# Patient Record
Sex: Male | Born: 1960 | Race: Black or African American | Hispanic: No | Marital: Single | Smoking: Former smoker
Health system: Southern US, Community
[De-identification: ages and names within clinical notes are randomized; demographics above are authoritative.]

## PROBLEM LIST (undated history)

## (undated) ENCOUNTER — Emergency Department (HOSPITAL_COMMUNITY): Admission: EM | Payer: No Typology Code available for payment source | Source: Home / Self Care

## (undated) DIAGNOSIS — K219 Gastro-esophageal reflux disease without esophagitis: Secondary | ICD-10-CM

## (undated) DIAGNOSIS — M1990 Unspecified osteoarthritis, unspecified site: Secondary | ICD-10-CM

## (undated) DIAGNOSIS — M549 Dorsalgia, unspecified: Secondary | ICD-10-CM

## (undated) DIAGNOSIS — I6529 Occlusion and stenosis of unspecified carotid artery: Secondary | ICD-10-CM

## (undated) DIAGNOSIS — I10 Essential (primary) hypertension: Secondary | ICD-10-CM

## (undated) HISTORY — DX: Occlusion and stenosis of unspecified carotid artery: I65.29

## (undated) HISTORY — DX: Essential (primary) hypertension: I10

---

## 1997-12-05 ENCOUNTER — Emergency Department (HOSPITAL_COMMUNITY): Admission: EM | Admit: 1997-12-05 | Discharge: 1997-12-05 | Payer: Self-pay | Admitting: Emergency Medicine

## 1998-02-24 ENCOUNTER — Emergency Department (HOSPITAL_COMMUNITY): Admission: EM | Admit: 1998-02-24 | Discharge: 1998-02-24 | Payer: Self-pay | Admitting: Emergency Medicine

## 2004-01-29 ENCOUNTER — Emergency Department (HOSPITAL_COMMUNITY): Admission: EM | Admit: 2004-01-29 | Discharge: 2004-01-29 | Payer: Self-pay | Admitting: Emergency Medicine

## 2005-01-20 ENCOUNTER — Emergency Department (HOSPITAL_COMMUNITY): Admission: EM | Admit: 2005-01-20 | Discharge: 2005-01-20 | Payer: Self-pay | Admitting: Emergency Medicine

## 2005-03-16 ENCOUNTER — Encounter: Admission: RE | Admit: 2005-03-16 | Discharge: 2005-03-16 | Payer: Self-pay | Admitting: Family Medicine

## 2013-10-14 ENCOUNTER — Encounter (HOSPITAL_COMMUNITY): Payer: Self-pay | Admitting: Emergency Medicine

## 2013-10-14 ENCOUNTER — Emergency Department (HOSPITAL_COMMUNITY)
Admission: EM | Admit: 2013-10-14 | Discharge: 2013-10-14 | Disposition: A | Discharge status: Home / Self Care | Payer: No Typology Code available for payment source | Attending: Emergency Medicine | Admitting: Emergency Medicine

## 2013-10-14 DIAGNOSIS — T671XXA Heat syncope, initial encounter: Secondary | ICD-10-CM | POA: Insufficient documentation

## 2013-10-14 DIAGNOSIS — R404 Transient alteration of awareness: Secondary | ICD-10-CM | POA: Insufficient documentation

## 2013-10-14 DIAGNOSIS — Y9229 Other specified public building as the place of occurrence of the external cause: Secondary | ICD-10-CM | POA: Insufficient documentation

## 2013-10-14 DIAGNOSIS — X30XXXA Exposure to excessive natural heat, initial encounter: Secondary | ICD-10-CM | POA: Insufficient documentation

## 2013-10-14 DIAGNOSIS — T671XXS Heat syncope, sequela: Secondary | ICD-10-CM

## 2013-10-14 DIAGNOSIS — Y9389 Activity, other specified: Secondary | ICD-10-CM | POA: Insufficient documentation

## 2013-10-14 HISTORY — DX: Dorsalgia, unspecified: M54.9

## 2013-10-14 LAB — COMPREHENSIVE METABOLIC PANEL
ALT: 15 U/L (ref 0–53)
AST: 19 U/L (ref 0–37)
Albumin: 3.9 g/dL (ref 3.5–5.2)
Alkaline Phosphatase: 68 U/L (ref 39–117)
Anion gap: 11 (ref 5–15)
BUN: 19 mg/dL (ref 6–23)
CO2: 25 mEq/L (ref 19–32)
Calcium: 9 mg/dL (ref 8.4–10.5)
Chloride: 103 mEq/L (ref 96–112)
Creatinine, Ser: 0.88 mg/dL (ref 0.50–1.35)
GFR calc Af Amer: >90 mL/min (ref >90)
GFR calc non Af Amer: >90 mL/min (ref >90)
Glucose, Bld: 130 mg/dL — ABNORMAL HIGH (ref 70–99)
Potassium: 3.1 mEq/L — ABNORMAL LOW (ref 3.7–5.3)
Sodium: 139 mEq/L (ref 137–147)
Total Bilirubin: 0.4 mg/dL (ref 0.3–1.2)
Total Protein: 7.1 g/dL (ref 6.0–8.3)

## 2013-10-14 LAB — CBC WITH DIFFERENTIAL/PLATELET
Basophils Absolute: 0 10*3/uL (ref 0.0–0.1)
Basophils Relative: 0 % (ref 0–1)
Eosinophils Absolute: 0 10*3/uL (ref 0.0–0.7)
Eosinophils Relative: 1 % (ref 0–5)
HCT: 38.7 % — ABNORMAL LOW (ref 39.0–52.0)
Hemoglobin: 14 g/dL (ref 13.0–17.0)
Lymphocytes Relative: 37 % (ref 12–46)
Lymphs Abs: 2.6 10*3/uL (ref 0.7–4.0)
MCH: 33 pg (ref 26.0–34.0)
MCHC: 36.2 g/dL — ABNORMAL HIGH (ref 30.0–36.0)
MCV: 91.3 fL (ref 78.0–100.0)
Monocytes Absolute: 0.6 10*3/uL (ref 0.1–1.0)
Monocytes Relative: 8 % (ref 3–12)
Neutro Abs: 3.7 10*3/uL (ref 1.7–7.7)
Neutrophils Relative %: 54 % (ref 43–77)
Platelets: 190 10*3/uL (ref 150–400)
RBC: 4.24 MIL/uL (ref 4.22–5.81)
RDW: 13.2 % (ref 11.5–15.5)
WBC: 6.9 10*3/uL (ref 4.0–10.5)

## 2013-10-14 LAB — TROPONIN I: Troponin I: <0.3 ng/mL (ref <0.30)

## 2013-10-14 MED ORDER — SODIUM CHLORIDE 0.9 % IV BOLUS (SEPSIS)
1000.0000 mL | Freq: Once | INTRAVENOUS | Status: AC
  Administered 2013-10-14: 1000 mL via INTRAVENOUS

## 2013-10-14 MED ORDER — POTASSIUM CHLORIDE CRYS ER 20 MEQ PO TBCR
40.0000 meq | EXTENDED_RELEASE_TABLET | Freq: Once | ORAL | Status: AC
  Administered 2013-10-14: 40 meq via ORAL
  Filled 2013-10-14: qty 2

## 2013-10-14 NOTE — ED Notes (Signed)
Pt states he got to hot at church and had a syncopal episode

## 2013-10-14 NOTE — ED Notes (Signed)
zofran 4 mg IV given by EMS prior to arrival

## 2013-10-14 NOTE — ED Provider Notes (Signed)
CSN: [id number]     Arrival date & time 10/14/13  1639 History  This chart was scribed for Brian LennertJoseph L Beldon Nowling, MD by Leona CarryG. Clay Sherrill, ED Scribe. The patient was seen in [facility]. The patient's care was started at 4:59 PM.     Chief Complaint  Patient presents with  . Loss of Consciousness    Patient is a 53 y.o. male presenting with syncope. The history is provided by the patient and the spouse. No language interpreter was used.  Loss of Consciousness Episode history:  Single Most recent episode:  Today Duration: "a few seconds" Progression:  Resolved Chronicity:  New Witnessed: yes   Associated symptoms: no chest pain, no headaches and no seizures    HPI Comments: Milus BanisterLewis Cannon is a 53 y.o. male who presents to the Emergency Department complaining of an episode of syncope that occurred earlier today. Patient reports that he was at church and began to feel very hot before losing consciousness. He reports one similar episode approximately 6 months ago, but states that he did not lose consciousness that time. Patient reports that he has not eaten anything prior to this syncopal episode. Patient's wife reports that he was only unconscious for "a few seconds." Patient denies CP.   Patient does not have a PCP.   Past Medical History  Diagnosis Date  . Back pain    History reviewed. No pertinent past surgical history. No family history on file. History  Substance Use Topics  . Smoking status: Never Smoker   . Smokeless tobacco: Not on file  . Alcohol Use: No    Review of Systems  Constitutional: Negative for appetite change and fatigue.  HENT: Negative for congestion, ear discharge and sinus pressure.   Eyes: Negative for discharge.  Respiratory: Negative for cough.   Cardiovascular: Positive for syncope. Negative for chest pain.  Gastrointestinal: Negative for abdominal pain and diarrhea.  Genitourinary: Negative for frequency and hematuria.  Musculoskeletal: Negative for back  pain.  Skin: Negative for rash.  Neurological: Positive for syncope. Negative for seizures and headaches.  Psychiatric/Behavioral: Negative for hallucinations.      Allergies  Review of patient's allergies indicates no known allergies.  Home Medications   Prior to Admission medications   Not on File   Triage Vitals: BP 100/70  Pulse 76  Temp(Src) 97.9 F (36.6 C)  Resp 14  SpO2 95% Physical Exam  Nursing note and vitals reviewed. Constitutional: He is oriented to person, place, and time. He appears well-developed.  HENT:  Head: Normocephalic.  Eyes: Conjunctivae and EOM are normal. No scleral icterus.  Neck: Neck supple. No thyromegaly present.  Cardiovascular: Normal rate and regular rhythm.  Exam reveals no gallop and no friction rub.   No murmur heard. Pulmonary/Chest: No stridor. He has no wheezes. He has no rales. He exhibits no tenderness.  Abdominal: He exhibits no distension. There is no tenderness. There is no rebound.  Musculoskeletal: Normal range of motion. He exhibits no edema.  Lymphadenopathy:    He has no cervical adenopathy.  Neurological: He is oriented to person, place, and time. He exhibits normal muscle tone. Coordination normal.  Skin: No rash noted. No erythema.  Psychiatric: He has a normal mood and affect. His behavior is normal.    ED Course  Procedures (including critical care time) DIAGNOSTIC STUDIES: Oxygen Saturation is 95% on room air, normal by my interpretation.    COORDINATION OF CARE: 5:03 PM-Discussed treatment plan which includes IV fluids, EKG, and labs  with pt at bedside and pt agreed to plan.     Labs Review Labs Reviewed - No data to display  Imaging Review No results found.   EKG Interpretation   Date/Time:  Sunday October 14 2013 16:42:42 EDT Ventricular Rate:  72 PR Interval:  205 QRS Duration: 77 QT Interval:  415 QTC Calculation: 454 R Axis:   14 Text Interpretation:  Sinus rhythm Borderline prolonged PR  interval  Borderline repolarization abnormality Confirmed by Caeleigh Prohaska  MD, Vijay Durflinger  [id number]) on 10/14/2013 6:34:31 PM      MDM   Final diagnoses:  None    Syncope,  From not eating ,  Pt to follow up with family mc  The chart was scribed for me under my direct supervision.  I personally performed the history, physical, and medical decision making and all procedures in the evaluation of this patient.Brian Lennert, MD 10/14/13 [phone]

## 2013-10-14 NOTE — Discharge Instructions (Signed)
Follow up this week for recheck.   You can try calling Dr. Lilly CoveNimish Gosrani for follow up

## 2017-06-20 ENCOUNTER — Encounter (HOSPITAL_COMMUNITY): Payer: Self-pay | Admitting: *Deleted

## 2017-06-20 ENCOUNTER — Emergency Department (HOSPITAL_COMMUNITY): Payer: Self-pay

## 2017-06-20 ENCOUNTER — Emergency Department (HOSPITAL_COMMUNITY)
Admission: EM | Admit: 2017-06-20 | Discharge: 2017-06-20 | Disposition: A | Discharge status: Home / Self Care | Payer: Self-pay | Attending: Emergency Medicine | Admitting: Emergency Medicine

## 2017-06-20 DIAGNOSIS — R110 Nausea: Secondary | ICD-10-CM

## 2017-06-20 DIAGNOSIS — R1013 Epigastric pain: Secondary | ICD-10-CM

## 2017-06-20 DIAGNOSIS — R142 Eructation: Secondary | ICD-10-CM | POA: Insufficient documentation

## 2017-06-20 DIAGNOSIS — R10811 Right upper quadrant abdominal tenderness: Secondary | ICD-10-CM

## 2017-06-20 DIAGNOSIS — R630 Anorexia: Secondary | ICD-10-CM | POA: Insufficient documentation

## 2017-06-20 LAB — COMPREHENSIVE METABOLIC PANEL
ALT: 14 U/L — ABNORMAL LOW (ref 17–63)
AST: 17 U/L (ref 15–41)
Albumin: 4.5 g/dL (ref 3.5–5.0)
Alkaline Phosphatase: 69 U/L (ref 38–126)
Anion gap: 12 (ref 5–15)
BUN: 11 mg/dL (ref 6–20)
CO2: 20 mmol/L — ABNORMAL LOW (ref 22–32)
Calcium: 9.6 mg/dL (ref 8.9–10.3)
Chloride: 108 mmol/L (ref 101–111)
Creatinine, Ser: 0.83 mg/dL (ref 0.61–1.24)
GFR calc Af Amer: >60 mL/min (ref >60)
GFR calc non Af Amer: >60 mL/min (ref >60)
Glucose, Bld: 97 mg/dL (ref 65–99)
Potassium: 3.5 mmol/L (ref 3.5–5.1)
Sodium: 140 mmol/L (ref 135–145)
Total Bilirubin: 0.9 mg/dL (ref 0.3–1.2)
Total Protein: 8.4 g/dL — ABNORMAL HIGH (ref 6.5–8.1)

## 2017-06-20 LAB — URINALYSIS, ROUTINE W REFLEX MICROSCOPIC
Bacteria, UA: NONE SEEN
Bilirubin Urine: NEGATIVE
Glucose, UA: NEGATIVE mg/dL
Ketones, ur: NEGATIVE mg/dL
Leukocytes, UA: NEGATIVE
Nitrite: NEGATIVE
Protein, ur: NEGATIVE mg/dL
Specific Gravity, Urine: 1.006 (ref 1.005–1.030)
WBC, UA: NONE SEEN WBC/hpf (ref 0–5)
pH: 9 — ABNORMAL HIGH (ref 5.0–8.0)

## 2017-06-20 LAB — CBC
HCT: 46.5 % (ref 39.0–52.0)
Hemoglobin: 16.8 g/dL (ref 13.0–17.0)
MCH: 34.1 pg — ABNORMAL HIGH (ref 26.0–34.0)
MCHC: 36.1 g/dL — ABNORMAL HIGH (ref 30.0–36.0)
MCV: 94.3 fL (ref 78.0–100.0)
Platelets: 258 10*3/uL (ref 150–400)
RBC: 4.93 MIL/uL (ref 4.22–5.81)
RDW: 12.9 % (ref 11.5–15.5)
WBC: 5.7 10*3/uL (ref 4.0–10.5)

## 2017-06-20 LAB — LIPASE, BLOOD: Lipase: 29 U/L (ref 11–51)

## 2017-06-20 MED ORDER — IOPAMIDOL (ISOVUE-300) INJECTION 61%
100.0000 mL | Freq: Once | INTRAVENOUS | Status: AC | PRN
  Administered 2017-06-20: 100 mL via INTRAVENOUS

## 2017-06-20 MED ORDER — MORPHINE SULFATE (PF) 4 MG/ML IV SOLN
4.0000 mg | Freq: Once | INTRAVENOUS | Status: AC
  Administered 2017-06-20: 4 mg via INTRAVENOUS
  Filled 2017-06-20: qty 1

## 2017-06-20 MED ORDER — CIPROFLOXACIN HCL 500 MG PO TABS: 500.0000 mg | ORAL_TABLET | Freq: Two times a day (BID) | ORAL | 0 refills | Status: DC

## 2017-06-20 MED ORDER — IOPAMIDOL (ISOVUE-300) INJECTION 61%
INTRAVENOUS | Status: AC
  Filled 2017-06-20: qty 100

## 2017-06-20 MED ORDER — METRONIDAZOLE 500 MG PO TABS: 500.0000 mg | ORAL_TABLET | Freq: Three times a day (TID) | ORAL | 0 refills | Status: AC

## 2017-06-20 MED ORDER — ONDANSETRON HCL 4 MG/2ML IJ SOLN
4.0000 mg | Freq: Once | INTRAMUSCULAR | Status: AC
  Administered 2017-06-20: 4 mg via INTRAVENOUS
  Filled 2017-06-20: qty 2

## 2017-06-20 MED ORDER — SODIUM CHLORIDE 0.9 % IV BOLUS
1000.0000 mL | Freq: Once | INTRAVENOUS | Status: AC
  Administered 2017-06-20: 1000 mL via INTRAVENOUS

## 2017-06-20 MED ORDER — ONDANSETRON 4 MG PO TBDP: 4.0000 mg | ORAL_TABLET | Freq: Three times a day (TID) | ORAL | 0 refills | Status: DC | PRN

## 2017-06-20 MED ORDER — ONDANSETRON 4 MG PO TBDP
4.0000 mg | ORAL_TABLET | Freq: Once | ORAL | Status: AC | PRN
  Administered 2017-06-20: 4 mg via ORAL
  Filled 2017-06-20: qty 1

## 2017-06-20 MED ORDER — HYDROCODONE-ACETAMINOPHEN 5-325 MG PO TABS: 1.0000 | ORAL_TABLET | ORAL | 0 refills | Status: DC | PRN

## 2017-06-20 NOTE — ED Notes (Signed)
IV removed, discharge instructions reviewed. Pt left with family and brother is taking pt home due to pt being given narcotics

## 2017-06-20 NOTE — Discharge Instructions (Addendum)
There is evidence of possible mild colitis on the CT scan.  Your symptoms may also be due to your gallbladder.  Hydration: Symptoms will be intensified and complicated by dehydration. Dehydration can also extend the duration of symptoms. Drink plenty of fluids and get plenty of rest. You should be drinking at least half a liter of water an hour to stay hydrated. Electrolyte drinks (ex. Gatorade, Powerade, Pedialyte) are also encouraged. You should be drinking enough fluids to make your urine light yellow, almost clear. If this is not the case, you are not drinking enough water. Please note that some of the treatments indicated below will not be effective if you are not adequately hydrated. Diet: Please concentrate on hydration, however, you may introduce food slowly.  Start with a clear liquid diet, progressed to a full liquid diet, and then bland solids as you are able. Pain or fever: Ibuprofen, Naproxen, or Tylenol for pain or fever.  Antiinflammatory medications: Take 600 mg of ibuprofen every 6 hours or 440 mg (over the counter dose) to 500 mg (prescription dose) of naproxen every 12 hours for the next 3 days. After this time, these medications may be used as needed for pain. Take these medications with food to avoid upset stomach. Choose only one of these medications, do not take them together. Tylenol: Should you continue to have additional pain while taking the ibuprofen or naproxen, you may add in tylenol as needed. Your daily total maximum amount of tylenol from all sources should be limited to 4000mg /day for persons without liver problems, or 2000mg /day for those with liver problems. Vicodin: May take Vicodin as needed for severe pain.  Do not drive or perform other dangerous activities while taking the Vicodin.  Please note that each pill of Vicodin contains 325 mg of Tylenol and the above dosage limits apply. Nausea/vomiting: Use the Zofran for nausea or vomiting.  Follow up: Follow-up with the  gastroenterologist as soon as possible.  Return to the ED for worsening symptoms.  Please also note the recommendation for repeat imaging of your abdominal aorta via ultrasound in 5 years to assure it has not increased in size. This information has been attached to this packet.

## 2017-06-20 NOTE — ED Provider Notes (Signed)
[facility]-EMERGENCY DEPT Provider Note   CSN: [id number] Arrival date & time: 06/20/17  1435     History   Chief Complaint Chief Complaint  Patient presents with  . Nausea    HPI Brian Cannon is a 57 y.o. male.  HPI  Brian Cannon is a 57 y.o. male, with a history of chronic back pain, presenting to the ED with abdominal pain intermittently for the past 7-8 days.  Pain is mostly in the epigastric and right upper quadrant regions, described as a squeezing or grabbing along with cramping, currently 8/10, radiating throughout the abdomen.  Accompanied by nausea, belching, and anorexia.  Pain seems to consistently flare with eating.  Diarrhea beginning last night.  He has tried Advil and Mylanta without relief.  He uses NSAIDs about once every 3 weeks.  He denies alcohol use.  Patient continues to have regular, daily bowel movements.  Denies fever, vomiting, hematochezia/melena, urinary complaints, cough, chest pain, shortness of breath, or any other complaints.   Past Medical History:  Diagnosis Date  . Back pain     There are no active problems to display for this patient.   History reviewed. No pertinent surgical history.      Home Medications    Prior to Admission medications   Medication Sig Start Date End Date Taking? Authorizing Provider  ciprofloxacin (CIPRO) 500 MG tablet Take 1 tablet (500 mg total) by mouth 2 (two) times daily. 06/20/17   Joy, Shawn C, PA-C  HYDROcodone-acetaminophen (NORCO/VICODIN) 5-325 MG tablet Take 1-2 tablets by mouth every 4 (four) hours as needed for severe pain. 06/20/17   Joy, Shawn C, PA-C  metroNIDAZOLE (FLAGYL) 500 MG tablet Take 1 tablet (500 mg total) by mouth 3 (three) times daily for 7 days. 06/20/17 06/27/17  Joy, Shawn C, PA-C  ondansetron (ZOFRAN ODT) 4 MG disintegrating tablet Take 1 tablet (4 mg total) by mouth every 8 (eight) hours as needed for nausea or vomiting. 06/20/17   Joy, Hillard DankerShawn C, PA-C    Family  History No family history on file.  Social History Social History   Tobacco Use  . Smoking status: Never Smoker  . Smokeless tobacco: Never Used  Substance Use Topics  . Alcohol use: No  . Drug use: No     Allergies   Patient has no known allergies.   Review of Systems Review of Systems  Constitutional: Negative for chills, diaphoresis and fever.  Respiratory: Negative for cough and shortness of breath.   Cardiovascular: Negative for chest pain.  Gastrointestinal: Positive for abdominal pain, diarrhea and nausea. Negative for abdominal distention, blood in stool, constipation and vomiting.  Genitourinary: Negative for difficulty urinating, dysuria, frequency and hematuria.  Musculoskeletal: Negative for back pain.  All other systems reviewed and are negative.    Physical Exam Updated Vital Signs BP 131/85   Pulse 84   Temp 98.2 F (36.8 C) (Oral)   Resp 18   SpO2 97%   Physical Exam  Constitutional: He appears well-developed and well-nourished. No distress.  HENT:  Head: Normocephalic and atraumatic.  Eyes: Conjunctivae are normal.  Neck: Neck supple.  Cardiovascular: Normal rate, regular rhythm, normal heart sounds and intact distal pulses.  Pulmonary/Chest: Effort normal and breath sounds normal. No respiratory distress.  Abdominal: Soft. There is tenderness. There is no guarding and no CVA tenderness.    Musculoskeletal: He exhibits no edema.  Lymphadenopathy:    He has no cervical adenopathy.  Neurological: He is alert.  Skin:  Skin is warm and dry. He is not diaphoretic.  Psychiatric: He has a normal mood and affect. His behavior is normal.  Nursing note and vitals reviewed.    ED Treatments / Results  Labs (all labs ordered are listed, but only abnormal results are displayed) Labs Reviewed  COMPREHENSIVE METABOLIC PANEL - Abnormal; Notable for the following components:      Result Value   CO2 20 (*)    Total Protein 8.4 (*)    ALT 14 (*)     All other components within normal limits  CBC - Abnormal; Notable for the following components:   MCH 34.1 (*)    MCHC 36.1 (*)    All other components within normal limits  URINALYSIS, ROUTINE W REFLEX MICROSCOPIC - Abnormal; Notable for the following components:   Color, Urine STRAW (*)    pH 9.0 (*)    Hgb urine dipstick SMALL (*)    Squamous Epithelial / LPF 0-5 (*)    All other components within normal limits  LIPASE, BLOOD    EKG None  Radiology Ct Abdomen Pelvis W Contrast  Result Date: 06/20/2017 CLINICAL DATA:  Abdominal pain with nausea and diarrhea EXAM: CT ABDOMEN AND PELVIS WITH CONTRAST TECHNIQUE: Multidetector CT imaging of the abdomen and pelvis was performed using the standard protocol following bolus administration of intravenous contrast. CONTRAST:  ISOVUE-300 IOPAMIDOL (ISOVUE-300) INJECTION 61% COMPARISON:  Ultrasound 06/20/2017 FINDINGS: Lower chest: No acute abnormality. Hepatobiliary: Subcentimeter hypodense lesions in the liver too small to further characterize. No calcified stones or biliary dilatation. Pancreas: Unremarkable. No pancreatic ductal dilatation or surrounding inflammatory changes. Spleen: Normal in size without focal abnormality. Adrenals/Urinary Tract: Adrenal glands are within normal limits. Cysts in the left kidney. Bladder unremarkable Stomach/Bowel: Stomach is within normal limits. Appendix appears normal. Mild wall thickening of the ascending colon. Sigmoid colon diverticular disease without acute inflammation. Vascular/Lymphatic: Moderate aortic atherosclerosis. Focal ectasia of the infrarenal abdominal aorta measuring up to 2.6 cm. No significantly enlarged lymph nodes. Reproductive: Prostate is unremarkable. Other: No abdominal wall hernia or abnormality. No abdominopelvic ascites. Small fat in the umbilical region. Musculoskeletal: No acute or significant osseous findings. IMPRESSION: 1. Possible mild wall thickening involving the ascending  colon as may be seen with mild colitis. 2. Otherwise no CT evidence for acute intra-abdominal or pelvic abnormality 3. Infrarenal abdominal aorta measures up to 2.6 cm. Ectatic abdominal aorta at risk for aneurysm development. Recommend followup by ultrasound in 5 years. This recommendation follows ACR consensus guidelines: White Paper of the ACR Incidental Findings Committee II on Vascular Findings. J Am Coll Radiol 2013; 10:789-794. Electronically Signed   By: Jasmine Pang M.D.   On: 06/20/2017 20:28   US Abdomen Limited Ruq  Result Date: 06/20/2017 CLINICAL DATA:  RIGHT upper quadrant pain and nausea for 2 weeks. EXAM: ULTRASOUND ABDOMEN LIMITED RIGHT UPPER QUADRANT COMPARISON:  None. FINDINGS: Gallbladder: No gallstones or wall thickening visualized. No sonographic Murphy sign noted by sonographer. Common bile duct: Diameter: 2 mm Liver: No focal lesion identified. Within normal limits in parenchymal echogenicity. Portal vein is patent on color Doppler imaging with normal direction of blood flow towards the liver. IMPRESSION: Normal RIGHT upper quadrant ultrasound. Electronically Signed   By: Awilda Metro M.D.   On: 06/20/2017 18:27    Procedures Procedures (including critical care time)  Medications Ordered in ED Medications  ondansetron (ZOFRAN-ODT) disintegrating tablet 4 mg (4 mg Oral Given 06/20/17 1452)  sodium chloride 0.9 % bolus 1,000 mL (0  mLs Intravenous Stopped 06/20/17 2157)  morphine 4 MG/ML injection 4 mg (4 mg Intravenous Given 06/20/17 1629)  ondansetron (ZOFRAN) injection 4 mg (4 mg Intravenous Given 06/20/17 1627)  iopamidol (ISOVUE-300) 61 % injection 100 mL (100 mLs Intravenous Contrast Given 06/20/17 1951)     Initial Impression / Assessment and Plan / ED Course  I have reviewed the triage vital signs and the nursing notes.  Pertinent labs & imaging results that were available during my care of the patient were reviewed by me and considered in my medical decision making  (see chart for details).  Clinical Course as of Jun 21 1133  Mon Jun 20, 2017  1746 Patient states he feels much better. Nausea has resolved.  Pain has improved to 3/10.   [SJ]  2045 Patient's symptoms have completely resolved.  No abdominal tenderness on reexam.   [SJ]    Clinical Course User Index [SJ] Joy, Shawn C, PA-C   Patient presents with intermittent abdominal pain.  Patient symptoms are suggestive of biliary colic. Patient is nontoxic appearing, afebrile, not tachycardic, not tachypneic, not hypotensive, and maintains excellent SPO2 on room air. No LFT or lipase elevations.  No leukocytosis.  Right upper quadrant ultrasound shows no acute abnormalities.  Abdominal CT shows possible mild colitis in the ascending colon.  This may or may not be what was causing the patient's symptoms.  Biliary colic is still a possibility.  Gastroenterology follow-up.  Tolerating PO at discharge. The patient was given instructions for home care as well as return precautions. Patient voices understanding of these instructions, accepts the plan, and is comfortable with discharge.   We were able to resolve his pain and nausea quite early into his ED course without recurrence.      Vitals:   06/20/17 1439 06/20/17 2155  BP: 131/85 111/88  Pulse: 84 79  Resp: 18 18  Temp: 98.2 F (36.8 C)   TempSrc: Oral   SpO2: 97% 99%   Patient's vitals were monitored throughout his ED course and were found to be consistent with the above ranges.  Final Clinical Impressions(s) / ED Diagnoses   Final diagnoses:  RUQ abdominal tenderness  Epigastric pain  Nausea    ED Discharge Orders        Ordered    HYDROcodone-acetaminophen (NORCO/VICODIN) 5-325 MG tablet  Every 4 hours PRN     06/20/17 2126    ondansetron (ZOFRAN ODT) 4 MG disintegrating tablet  Every 8 hours PRN     06/20/17 2126    ciprofloxacin (CIPRO) 500 MG tablet  2 times daily     06/20/17 2126    metroNIDAZOLE (FLAGYL) 500 MG tablet  3  times daily     06/20/17 2126       Concepcion Living 06/21/17 1135    Nira Conn, MD 06/21/17 [phone]

## 2017-06-20 NOTE — ED Triage Notes (Signed)
Per EMS, pt from home complains of nausea x 3 days. PT had diarrhea last night. Pt has not vomited, denies pain.   BP 146/86 HR 72 RR 16 O2 98% on RA CBG 89

## 2021-06-06 ENCOUNTER — Inpatient Hospital Stay (HOSPITAL_COMMUNITY)
Admission: EM | Admit: 2021-06-06 | Discharge: 2021-06-11 | DRG: 300 | Disposition: A | Discharge status: Home / Self Care | Payer: Self-pay | Attending: Internal Medicine | Admitting: Internal Medicine

## 2021-06-06 ENCOUNTER — Emergency Department (HOSPITAL_COMMUNITY): Payer: Self-pay

## 2021-06-06 ENCOUNTER — Encounter (HOSPITAL_COMMUNITY): Payer: Self-pay

## 2021-06-06 DIAGNOSIS — Z8042 Family history of malignant neoplasm of prostate: Secondary | ICD-10-CM

## 2021-06-06 DIAGNOSIS — I7102 Dissection of abdominal aorta: Secondary | ICD-10-CM | POA: Diagnosis present

## 2021-06-06 DIAGNOSIS — I714 Abdominal aortic aneurysm, without rupture, unspecified: Secondary | ICD-10-CM

## 2021-06-06 DIAGNOSIS — I71019 Dissection of thoracic aorta, unspecified: Secondary | ICD-10-CM | POA: Diagnosis present

## 2021-06-06 DIAGNOSIS — E871 Hypo-osmolality and hyponatremia: Secondary | ICD-10-CM

## 2021-06-06 DIAGNOSIS — Z8249 Family history of ischemic heart disease and other diseases of the circulatory system: Secondary | ICD-10-CM

## 2021-06-06 DIAGNOSIS — Z79899 Other long term (current) drug therapy: Secondary | ICD-10-CM

## 2021-06-06 DIAGNOSIS — F17210 Nicotine dependence, cigarettes, uncomplicated: Secondary | ICD-10-CM | POA: Diagnosis present

## 2021-06-06 DIAGNOSIS — K5900 Constipation, unspecified: Secondary | ICD-10-CM | POA: Clinically undetermined

## 2021-06-06 DIAGNOSIS — I71012 Dissection of descending thoracic aorta: Secondary | ICD-10-CM

## 2021-06-06 DIAGNOSIS — Z20822 Contact with and (suspected) exposure to covid-19: Secondary | ICD-10-CM | POA: Diagnosis present

## 2021-06-06 DIAGNOSIS — I7103 Dissection of thoracoabdominal aorta: Principal | ICD-10-CM | POA: Diagnosis present

## 2021-06-06 DIAGNOSIS — I708 Atherosclerosis of other arteries: Secondary | ICD-10-CM | POA: Diagnosis present

## 2021-06-06 DIAGNOSIS — E876 Hypokalemia: Secondary | ICD-10-CM

## 2021-06-06 DIAGNOSIS — R109 Unspecified abdominal pain: Secondary | ICD-10-CM | POA: Clinically undetermined

## 2021-06-06 DIAGNOSIS — I10 Essential (primary) hypertension: Secondary | ICD-10-CM

## 2021-06-06 LAB — CBC
HCT: 38.5 % — ABNORMAL LOW (ref 39.0–52.0)
HCT: 39.5 % (ref 39.0–52.0)
HCT: 37.9 % — ABNORMAL LOW (ref 39.0–52.0)
HCT: 38.1 % — ABNORMAL LOW (ref 39.0–52.0)
Hemoglobin: 13.8 g/dL (ref 13.0–17.0)
Hemoglobin: 13.7 g/dL (ref 13.0–17.0)
Hemoglobin: 13.4 g/dL (ref 13.0–17.0)
Hemoglobin: 13.5 g/dL (ref 13.0–17.0)
MCH: 33.7 pg (ref 26.0–34.0)
MCH: 33 pg (ref 26.0–34.0)
MCH: 33.8 pg (ref 26.0–34.0)
MCH: 33.8 pg (ref 26.0–34.0)
MCHC: 35.8 g/dL (ref 30.0–36.0)
MCHC: 34.7 g/dL (ref 30.0–36.0)
MCHC: 35.4 g/dL (ref 30.0–36.0)
MCHC: 35.4 g/dL (ref 30.0–36.0)
MCV: 94.1 fL (ref 80.0–100.0)
MCV: 95.2 fL (ref 80.0–100.0)
MCV: 95.7 fL (ref 80.0–100.0)
MCV: 95.5 fL (ref 80.0–100.0)
Platelets: 197 10*3/uL (ref 150–400)
Platelets: 200 10*3/uL (ref 150–400)
Platelets: 181 10*3/uL (ref 150–400)
Platelets: 176 10*3/uL (ref 150–400)
RBC: 4.09 MIL/uL — ABNORMAL LOW (ref 4.22–5.81)
RBC: 4.15 MIL/uL — ABNORMAL LOW (ref 4.22–5.81)
RBC: 3.96 MIL/uL — ABNORMAL LOW (ref 4.22–5.81)
RBC: 3.99 MIL/uL — ABNORMAL LOW (ref 4.22–5.81)
RDW: 13 % (ref 11.5–15.5)
RDW: 12.8 % (ref 11.5–15.5)
RDW: 13 % (ref 11.5–15.5)
RDW: 13.1 % (ref 11.5–15.5)
WBC: 8.1 10*3/uL (ref 4.0–10.5)
WBC: 8.2 10*3/uL (ref 4.0–10.5)
WBC: 8.2 10*3/uL (ref 4.0–10.5)
WBC: 7.2 10*3/uL (ref 4.0–10.5)
nRBC: 0 % (ref 0.0–0.2)
nRBC: 0 % (ref 0.0–0.2)
nRBC: 0 % (ref 0.0–0.2)
nRBC: 0 % (ref 0.0–0.2)

## 2021-06-06 LAB — SURGICAL PCR SCREEN
MRSA, PCR: NEGATIVE
Staphylococcus aureus: NEGATIVE

## 2021-06-06 LAB — COMPREHENSIVE METABOLIC PANEL
ALT: 17 U/L (ref 0–44)
ALT: 16 U/L (ref 0–44)
AST: 25 U/L (ref 15–41)
AST: 17 U/L (ref 15–41)
Albumin: 4.1 g/dL (ref 3.5–5.0)
Albumin: 3.3 g/dL — ABNORMAL LOW (ref 3.5–5.0)
Alkaline Phosphatase: 56 U/L (ref 38–126)
Alkaline Phosphatase: 52 U/L (ref 38–126)
Anion gap: 10 (ref 5–15)
Anion gap: 7 (ref 5–15)
BUN: 18 mg/dL (ref 8–23)
BUN: 11 mg/dL (ref 8–23)
CO2: 22 mmol/L (ref 22–32)
CO2: 23 mmol/L (ref 22–32)
Calcium: 8.7 mg/dL — ABNORMAL LOW (ref 8.9–10.3)
Calcium: 8.4 mg/dL — ABNORMAL LOW (ref 8.9–10.3)
Chloride: 102 mmol/L (ref 98–111)
Chloride: 106 mmol/L (ref 98–111)
Creatinine, Ser: 0.91 mg/dL (ref 0.61–1.24)
Creatinine, Ser: 0.87 mg/dL (ref 0.61–1.24)
GFR, Estimated: >60 mL/min (ref >60)
GFR, Estimated: >60 mL/min (ref >60)
Glucose, Bld: 107 mg/dL — ABNORMAL HIGH (ref 70–99)
Glucose, Bld: 105 mg/dL — ABNORMAL HIGH (ref 70–99)
Potassium: 3.2 mmol/L — ABNORMAL LOW (ref 3.5–5.1)
Potassium: 4.1 mmol/L (ref 3.5–5.1)
Sodium: 134 mmol/L — ABNORMAL LOW (ref 135–145)
Sodium: 136 mmol/L (ref 135–145)
Total Bilirubin: 0.6 mg/dL (ref 0.3–1.2)
Total Bilirubin: 0.9 mg/dL (ref 0.3–1.2)
Total Protein: 7.4 g/dL (ref 6.5–8.1)
Total Protein: 6.1 g/dL — ABNORMAL LOW (ref 6.5–8.1)

## 2021-06-06 LAB — RAPID URINE DRUG SCREEN, HOSP PERFORMED
Amphetamines: NOT DETECTED
Barbiturates: NOT DETECTED
Benzodiazepines: NOT DETECTED
Cocaine: NOT DETECTED
Opiates: NOT DETECTED
Tetrahydrocannabinol: POSITIVE — AB

## 2021-06-06 LAB — LIPASE, BLOOD: Lipase: 30 U/L (ref 11–51)

## 2021-06-06 LAB — BASIC METABOLIC PANEL
Anion gap: 8 (ref 5–15)
BUN: 10 mg/dL (ref 8–23)
CO2: 23 mmol/L (ref 22–32)
Calcium: 8.3 mg/dL — ABNORMAL LOW (ref 8.9–10.3)
Chloride: 106 mmol/L (ref 98–111)
Creatinine, Ser: 0.93 mg/dL (ref 0.61–1.24)
GFR, Estimated: >60 mL/min (ref >60)
Glucose, Bld: 89 mg/dL (ref 70–99)
Potassium: 3.3 mmol/L — ABNORMAL LOW (ref 3.5–5.1)
Sodium: 137 mmol/L (ref 135–145)

## 2021-06-06 LAB — PROTIME-INR
INR: 1 (ref 0.8–1.2)
Prothrombin Time: 13.6 seconds (ref 11.4–15.2)

## 2021-06-06 LAB — LACTIC ACID, PLASMA
Lactic Acid, Venous: 1.4 mmol/L (ref 0.5–1.9)
Lactic Acid, Venous: 1.1 mmol/L (ref 0.5–1.9)

## 2021-06-06 LAB — HIV ANTIBODY (ROUTINE TESTING W REFLEX): HIV Screen 4th Generation wRfx: NONREACTIVE

## 2021-06-06 LAB — CREATININE, SERUM
Creatinine, Ser: 0.78 mg/dL (ref 0.61–1.24)
GFR, Estimated: >60 mL/min (ref >60)

## 2021-06-06 LAB — MAGNESIUM: Magnesium: 1.7 mg/dL (ref 1.7–2.4)

## 2021-06-06 MED ORDER — CHLORHEXIDINE GLUCONATE CLOTH 2 % EX PADS
6.0000 | MEDICATED_PAD | Freq: Every day | CUTANEOUS | Status: DC
  Administered 2021-06-06 – 2021-06-09 (×6): 6 via TOPICAL

## 2021-06-06 MED ORDER — ONDANSETRON HCL 4 MG PO TABS: 4.0000 mg | ORAL_TABLET | Freq: Four times a day (QID) | ORAL | Status: DC | PRN

## 2021-06-06 MED ORDER — ONDANSETRON HCL 4 MG/5ML PO SOLN
4.0000 mg | Freq: Once | ORAL | Status: DC
  Filled 2021-06-06: qty 5

## 2021-06-06 MED ORDER — ONDANSETRON HCL 4 MG/2ML IJ SOLN
4.0000 mg | Freq: Four times a day (QID) | INTRAMUSCULAR | Status: DC | PRN
  Administered 2021-06-06 – 2021-06-10 (×6): 4 mg via INTRAVENOUS
  Filled 2021-06-06 (×6): qty 2

## 2021-06-06 MED ORDER — SODIUM CHLORIDE 0.9 % IV SOLN
1000.0000 mL | INTRAVENOUS | Status: DC
  Administered 2021-06-06: 1000 mL via INTRAVENOUS

## 2021-06-06 MED ORDER — HYDROMORPHONE HCL 2 MG/ML IJ SOLN
2.0000 mg | INTRAMUSCULAR | Status: AC
  Administered 2021-06-06: 2 mg via INTRAVENOUS
  Filled 2021-06-06: qty 1

## 2021-06-06 MED ORDER — HYDROMORPHONE HCL 1 MG/ML IJ SOLN
2.0000 mg | INTRAMUSCULAR | Status: DC | PRN
  Administered 2021-06-06 – 2021-06-07 (×3): 2 mg via INTRAVENOUS
  Filled 2021-06-06 (×4): qty 2

## 2021-06-06 MED ORDER — LABETALOL HCL 5 MG/ML IV SOLN
0.5000 mg/min | Status: DC
  Administered 2021-06-06: 0.5 mg/min via INTRAVENOUS
  Administered 2021-06-06: 2 mg/min via INTRAVENOUS
  Administered 2021-06-07: 1 mg/min via INTRAVENOUS
  Administered 2021-06-07: 2 mg/min via INTRAVENOUS
  Administered 2021-06-07: 1 mg/min via INTRAVENOUS
  Administered 2021-06-07 – 2021-06-08 (×2): 3 mg/min via INTRAVENOUS
  Filled 2021-06-06 (×9): qty 80

## 2021-06-06 MED ORDER — LABETALOL HCL 100 MG PO TABS
100.0000 mg | ORAL_TABLET | Freq: Two times a day (BID) | ORAL | Status: DC
Start: 2021-06-06 — End: 2021-06-08
  Administered 2021-06-07 (×2): 100 mg via ORAL
  Filled 2021-06-06 (×2): qty 1

## 2021-06-06 MED ORDER — POTASSIUM CHLORIDE CRYS ER 20 MEQ PO TBCR
40.0000 meq | EXTENDED_RELEASE_TABLET | Freq: Two times a day (BID) | ORAL | Status: AC
  Administered 2021-06-06 (×2): 40 meq via ORAL
  Filled 2021-06-06 (×2): qty 2

## 2021-06-06 MED ORDER — LABETALOL HCL 5 MG/ML IV SOLN
10.0000 mg | Freq: Once | INTRAVENOUS | Status: AC
  Administered 2021-06-06: 10 mg via INTRAVENOUS
  Filled 2021-06-06: qty 4

## 2021-06-06 MED ORDER — PROCHLORPERAZINE EDISYLATE 10 MG/2ML IJ SOLN
10.0000 mg | Freq: Once | INTRAMUSCULAR | Status: AC
  Administered 2021-06-06: 10 mg via INTRAVENOUS
  Filled 2021-06-06: qty 2

## 2021-06-06 MED ORDER — IOHEXOL 300 MG/ML  SOLN
100.0000 mL | Freq: Once | INTRAMUSCULAR | Status: AC | PRN
  Administered 2021-06-06: 100 mL via INTRAVENOUS

## 2021-06-06 MED ORDER — SODIUM CHLORIDE 0.9 % IV SOLN: INTRAVENOUS | Status: AC

## 2021-06-06 MED ORDER — HYDROMORPHONE HCL 2 MG/ML IJ SOLN: 2.0000 mg | INTRAMUSCULAR | Status: DC | PRN

## 2021-06-06 MED ORDER — LIDOCAINE VISCOUS HCL 2 % MT SOLN
15.0000 mL | Freq: Once | OROMUCOSAL | Status: AC
  Administered 2021-06-06: 15 mL via ORAL
  Filled 2021-06-06: qty 15

## 2021-06-06 MED ORDER — IOHEXOL 350 MG/ML SOLN
90.0000 mL | Freq: Once | INTRAVENOUS | Status: AC | PRN
  Administered 2021-06-06: 90 mL via INTRAVENOUS

## 2021-06-06 MED ORDER — SODIUM CHLORIDE 0.9 % IV BOLUS (SEPSIS)
1000.0000 mL | Freq: Once | INTRAVENOUS | Status: AC
  Administered 2021-06-06: 1000 mL via INTRAVENOUS

## 2021-06-06 MED ORDER — POLYETHYLENE GLYCOL 3350 17 G PO PACK: 17.0000 g | PACK | Freq: Two times a day (BID) | ORAL | Status: AC

## 2021-06-06 MED ORDER — ENOXAPARIN SODIUM 40 MG/0.4ML IJ SOSY
40.0000 mg | PREFILLED_SYRINGE | INTRAMUSCULAR | Status: DC
  Administered 2021-06-06 – 2021-06-11 (×6): 40 mg via SUBCUTANEOUS
  Filled 2021-06-06 (×6): qty 0.4

## 2021-06-06 MED ORDER — POLYETHYLENE GLYCOL 3350 17 G PO PACK
17.0000 g | PACK | Freq: Every day | ORAL | Status: DC | PRN
  Administered 2021-06-07 – 2021-06-10 (×2): 17 g via ORAL
  Filled 2021-06-06 (×2): qty 1

## 2021-06-06 MED ORDER — SODIUM CHLORIDE 0.9 % IV BOLUS
1000.0000 mL | Freq: Once | INTRAVENOUS | Status: AC
  Administered 2021-06-06: 1000 mL via INTRAVENOUS

## 2021-06-06 MED ORDER — ALUM & MAG HYDROXIDE-SIMETH 200-200-20 MG/5ML PO SUSP
30.0000 mL | Freq: Once | ORAL | Status: AC
  Administered 2021-06-06: 30 mL via ORAL
  Filled 2021-06-06: qty 30

## 2021-06-06 MED ORDER — HYDROCODONE-ACETAMINOPHEN 5-325 MG PO TABS
1.0000 | ORAL_TABLET | ORAL | Status: DC | PRN
  Administered 2021-06-06 – 2021-06-08 (×9): 2 via ORAL
  Administered 2021-06-09: 1 via ORAL
  Filled 2021-06-06 (×9): qty 2
  Filled 2021-06-06: qty 1
  Filled 2021-06-06 (×2): qty 2

## 2021-06-06 MED ORDER — HYDROMORPHONE HCL 1 MG/ML IJ SOLN
1.0000 mg | Freq: Once | INTRAMUSCULAR | Status: AC
  Administered 2021-06-06: 1 mg via INTRAVENOUS
  Filled 2021-06-06: qty 1

## 2021-06-06 MED ORDER — HYOSCYAMINE SULFATE 0.125 MG SL SUBL
0.2500 mg | SUBLINGUAL_TABLET | Freq: Once | SUBLINGUAL | Status: AC
  Administered 2021-06-06: 0.25 mg via SUBLINGUAL
  Filled 2021-06-06: qty 2

## 2021-06-06 NOTE — ED Triage Notes (Signed)
Pt comes via [facility] from home for abd pain, epigastric, hx of GERD not relieved by OTC meds, PTA received of fentanyl and 16 mg of ketamine and 4mg  of zofran  ?

## 2021-06-06 NOTE — Plan of Care (Signed)

## 2021-06-06 NOTE — H&P (Addendum)
? ? ?History and Physical ? ?Brian Cannon DVV:[id number] DOB: 03/20/1960 DOA: 06/06/2021 ? ?PCP: Pcp, No ?Patient coming from: Home ? ?I have personally briefly reviewed patient's old medical records in [facility] ? ? ?Chief Complaint: Abdominal pain ? ?HPI: Brian Cannon is a 61 y.o. male past medical history of tobacco abuse for 40 years having abdominal pain 5 days prior to admission he relates pain which has progressively gotten worse is just constant radiates to his back a comfortable position he denies any fever cough or diarrhea. 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 ? ?Review of Systems: All systems reviewed and apart from history of presenting illness, are negative. ? ?Past Medical History:  ?Diagnosis Date  ? Back pain   ? ?History reviewed. No pertinent surgical history. ?Social History:  reports that he has been smoking cigarettes. He has been smoking an average of 1 pack per day. He has never used smokeless tobacco. He reports current alcohol use of about 1.0 standard drink per week. He reports that he does not use drugs. ? ? ?No Known Allergies ? ?Family History  ?Problem Relation Age of Onset  ? Heart attack Mother   ? Prostate cancer Father   ? ? ?Prior to Admission medications   ?Medication Sig Start Date End Date Taking? Authorizing Provider  ?ciprofloxacin (CIPRO) 500 MG tablet Take 1 tablet (500 mg total) by mouth 2 (two) times daily. 06/20/17   Joy, Shawn C, PA-C  ?HYDROcodone-acetaminophen (NORCO/VICODIN) 5-325 MG tablet Take 1-2 tablets by mouth every 4 (four) hours as needed for severe pain. 06/20/17   Joy, Shawn C, PA-C  ?ondansetron (ZOFRAN ODT) 4 MG disintegrating tablet Take 1 tablet (4 mg  total) by mouth every 8 (eight) hours as needed for nausea or vomiting. 06/20/17   Joy, Hillard Danker, PA-C  ? ?Physical Exam: ?Vitals:  ? 06/06/21 0645 06/06/21 0700 06/06/21 0715 06/06/21 0730  ?BP: (!) 157/81 (!) 140/57 (!) 142/88 132/68  ?Pulse: 80 73 75 72  ?Resp: 14 12 12 13   ?Temp:      ?TempSrc:      ?SpO2: 92% 93% 90% 92%  ? ? ?General exam: Moderately built and nourished patient, lying comfortably supine on the gurney in no obvious distress. ?Head, eyes and ENT: Nontraumatic and normocephalic.  ?Neck: Supple. No JVD, carotid bruit or thyromegaly. ?Lymphatics: No lymphadenopathy. ?Respiratory system: Clear to auscultation. No increased work of breathing. ?Cardiovascular system: S1 and S2 heard, RRR. No JVD. ?Gastrointestinal system: Abdomen is nondistended, soft and nontender. ?Central nervous system: Alert and oriented. No focal neurological deficits. ?Extremities: Symmetric 5 x 5 power. Peripheral pulses symmetrically felt.  ?Skin: No rashes or acute findings. ?Musculoskeletal system: Negative exam. ?Psychiatry: Pleasant and cooperative. ? ? ?Labs on Admission:  ?Basic Metabolic Panel: ?Recent Labs  ?Lab 06/06/21 ?0253  ?NA 134*  ?K 3.2*  ?CL 102  ?CO2 22  ?GLUCOSE 107*  ?BUN 18  ?CREATININE 0.91  ?CALCIUM 8.7*  ? ?Liver Function Tests: ?Recent Labs  ?Lab 06/06/21 ?0253  ?AST 25  ?ALT 17  ?ALKPHOS 56  ?BILITOT 0.6  ?PROT 7.4  ?ALBUMIN 4.1  ? ?Recent Labs  ?Lab 06/06/21 ?0253  ?LIPASE 30  ? ?No  results for input(s): AMMONIA in the last 168 hours. ?CBC: ?Recent Labs  ?Lab 06/06/21 ?0253  ?WBC 8.1  ?HGB 13.8  ?HCT 38.5*  ?MCV 94.1  ?PLT 197  ? ?Cardiac Enzymes: ?No results for input(s): CKTOTAL, CKMB, CKMBINDEX, TROPONINI in the last 168 hours. ? ?BNP (last 3 results) ?No results for input(s): PROBNP in the last 8760 hours. ?CBG: ?No results for input(s): GLUCAP in the last 168 hours. ? ?Radiological Exams on Admission: ?CT ABDOMEN PELVIS W CONTRAST ? ?Result Date: 06/06/2021 ?CLINICAL DATA:  Bowel obstruction  suspected. EXAM: CT ABDOMEN AND PELVIS WITH CONTRAST TECHNIQUE: Multidetector CT imaging of the abdomen and pelvis was performed using the standard protocol following bolus administration of intravenous contrast. RADIATION DOSE REDUCTION: This exam was performed according to the departmental dose-optimization program which includes automated exposure control, adjustment of the mA and/or kV according to patient size and/or use of iterative reconstruction technique. CONTRAST:  OMNIPAQUE IOHEXOL 300 MG/ML  SOLN COMPARISON:  06/20/2017 FINDINGS: Lower chest: Descending thoracic aortic dissection which is interval. No dilatation of the covered aorta. Fairly symmetric enhancement of the lumens. Hepatobiliary: Scattered small cystic densities in the liver.No evidence of biliary obstruction or stone. Pancreas: Unremarkable. Spleen: Unremarkable. Adrenals/Urinary Tract: Negative adrenals. No hydronephrosis or stone. Left renal cysts measuring up to 3.6 cm. Unremarkable bladder. Stomach/Bowel: No visible bowel obstruction or ischemia. Negative appendix. Vascular/Lymphatic: The aortic dissection continues down the abdominal aorta with fairly symmetric lumen enhancement. The flap appears to terminate in the distal aorta where there is bulky calcified plaque. At least moderate narrowing at the left common carotid origin. The distal aortic and proximal iliac wall appears thickened circumferentially when compared to prior. No noted rupture or aneurysm. No mass or adenopathy. Reproductive:Bilateral varicocele appearance with probable right partially covered hydrocele. Other: No ascites or pneumoperitoneum Musculoskeletal: No acute finding.  Lower lumbar spine degeneration. Critical Value/emergent results were called by telephone at the time of interpretation on 06/06/2021 at 5:39 am to provider PEDRO CARDAMA. IMPRESSION: 1. Aortic dissection since 2019 comparison beginning in the chest (origin not covered) and terminating near  the aortic bifurcation. Fairly symmetric enhancing lumens and visceral branches. There is inflammatory type wall thickening of the distal aorta and proximal iliacs since 2019. 2. Stenotic left iliac artery origin. Electronically Signed   By: Tiburcio Pea M.D.   On: 06/06/2021 05:38  ? ?CT Angio Chest/Abd/Pel for Dissection W and/or Wo Contrast ? ?Result Date: 06/06/2021 ?CLINICAL DATA:  Aortic dissection evaluation EXAM: CT ANGIOGRAPHY CHEST, ABDOMEN AND PELVIS TECHNIQUE: Non-contrast CT of the chest was initially obtained. Multidetector CT imaging through the chest, abdomen and pelvis was performed using the standard protocol during bolus administration of intravenous contrast. Multiplanar reconstructed images and MIPs were obtained and reviewed to evaluate the vascular anatomy. RADIATION DOSE REDUCTION: This exam was performed according to the departmental dose-optimization program which includes automated exposure control, adjustment of the mA and/or kV according to patient size and/or use of iterative reconstruction technique. CONTRAST:  83mL OMNIPAQUE IOHEXOL 350 MG/ML SOLN COMPARISON:  Abdominal CT from earlier the same day FINDINGS: CTA CHEST FINDINGS Cardiovascular:  No aortic intramural hematoma. Normal heart size. No pericardial effusion. Atheromatous calcification along the LAD. Aortic dissection beginning at the isthmus (just beyond the left subclavian origin) with broad defect in the dissection flap and symmetric luminal enhancement. The isthmic aorta measures up to 3.5 cm diameter. Mediastinum/Nodes: No hematoma or pneumomediastinum. Lungs/Pleura: Paraseptal emphysema and centrilobular emphysema at the apices. Dependent atelectasis. There is no  edema, consolidation, effusion, or pneumothorax. Musculoskeletal: No acute finding Review of the MIP images confirms the above findings. CTA ABDOMEN AND PELVIS FINDINGS VASCULAR Aorta: Known aortic dissection. Symmetric luminal enhancement. The true lumen  serves the celiac, SMA, and right renal arteries.The displaced intimal calcification distally is heavily calcified with difficult to visualize terminating fenestration. Better seen on prior study, there is circumf

## 2021-06-06 NOTE — Progress Notes (Signed)
?  Daily Progress Note ? ? ?ASSESSMENT/PLAN:  ? ?Called by [facility] long ED for new type B aortic dissection zones 2-9. 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  Left renal perfused via false lumen ?Pain improved with anti-impulse control. ?Recommended transfer to [facility], systolic blood pressure less than 120, heart rate less than 80. ?Patient needs tox screen. ?We will see on arrival.  ? ? ?J. Gillis Santa MD MS ?[facility] ?[phone] ?06/06/2021  ?8:35 AM ? ?

## 2021-06-06 NOTE — Consult Note (Addendum)
?Hospital Consult ? ? ?Reason for Consult: Acute type B aortic dissection ?[facility]ing [facility] ?MRN #:  [id number] ? ?History of Present Illness: Brian is a healthy 61 y.o. Cannon who presented to the [facility] long hospital with a several-hour history of severe back and abdominal pain.  Imaging demonstrated type B aortic dissection.  Brian Cannon was started on antiimpulse therapy and was transferred to [facility] for further evaluation and treatment. ? ?On exam, Brian Cannon was much more comfortable than on initial presentation.  Heart rate less than 80, systolic blood pressure less than 120.  Abdominal and back pain resolved. ? ?Brian Cannon denied use of stimulant medications, history of uncontrolled hypertension.  Brian Cannon noted having intercourse last night, but was unsure as to the timeline between Brian and his back and abdominal pain.  No family history of dissection. Brian Cannon was very tired during our exam after being up all night and pain, and dozed off intermittently. ? ? ? ?Past Medical History:  ?Diagnosis Date  ? Back pain   ? ? ?History reviewed. No pertinent surgical history. ? ?No Known Allergies ? ?Prior to Admission medications   ?Medication Sig Start Date End Date Taking? Authorizing Provider  ?ciprofloxacin (CIPRO) 500 MG tablet Take 1 tablet (500 mg total) by mouth 2 (two) times daily. 06/20/17   Joy, Shawn C, PA-C  ?HYDROcodone-acetaminophen (NORCO/VICODIN) 5-325 MG tablet Take 1-2 tablets by mouth every 4 (four) hours as needed for severe pain. 06/20/17   Joy, Shawn C, PA-C  ?ondansetron (ZOFRAN ODT) 4 MG disintegrating tablet Take 1 tablet (4 mg total) by mouth every 8 (eight) hours as needed for nausea or vomiting. 06/20/17   Joy, Hillard Danker, PA-C  ? ? ?Social History  ? ?Socioeconomic History  ? Marital status: Single  ?  Spouse name: Not on file  ? Number of children: Not on file  ? Years of education: Not on file  ? Highest education level: Not on file  ?Occupational History  ? Not on file  ?Tobacco Use  ? Smoking  status: Every Day  ?  Packs/day: 1.00  ?  Types: Cigarettes  ? Smokeless tobacco: Never  ?Substance and Sexual Activity  ? Alcohol use: Yes  ?  Alcohol/week: 1.0 standard drink  ?  Types: 1 Cans of beer per week  ? Drug use: No  ? Sexual activity: Yes  ?Other Topics Concern  ? Not on file  ?Social History Narrative  ? Not on file  ? ?Social Determinants of Health  ? ?Financial Resource Strain: Not on file  ?Food Insecurity: Not on file  ?Transportation Needs: Not on file  ?Physical Activity: Not on file  ?Stress: Not on file  ?Social Connections: Not on file  ?Intimate Partner Violence: Not on file  ? ? ?Family History  ?Problem Relation Age of Onset  ? Heart attack Mother   ? Prostate cancer Father   ? ? ?ROS: Otherwise negative unless mentioned in HPI ? ?Physical Examination ? ?Vitals:  ? 06/06/21 0930 06/06/21 0945  ?BP: 110/71 103/62  ?Pulse: 70 67  ?Resp: 14 14  ?Temp:    ?SpO2: 92% 91%  ? ?There is no height or weight on file to calculate BMI. ? ?General:  WDWN in NAD ?Gait: Not observed ?HENT: WNL, normocephalic ?Pulmonary: normal non-labored breathing, without Rales, rhonchi,  wheezing ?Cardiac: regularcarotid bruits ?Abdomen: soft, NT/ND, no masses ?Skin: without rashes ?Vascular Exam/Pulses: 2+ pedal pulses 2+ radial pulses ?Extremities: without ischemic changes, without Gangrene , without cellulitis;  without open wounds;  ?Musculoskeletal: no muscle wasting or atrophy  ?Neurologic: A&O X 3;  No focal weakness or paresthesias are detected; speech is fluent/normal ?Psychiatric:  The Brian Cannon has Normal affect. ?Lymph:  Unremarkable ? ?CBC ?   ?Component Value Date/Time  ? WBC 8.2 06/06/2021 1015  ? RBC 4.15 (L) 06/06/2021 1015  ? HGB 13.7 06/06/2021 1015  ? HCT 39.5 06/06/2021 1015  ? PLT 200 06/06/2021 1015  ? MCV 95.2 06/06/2021 1015  ? MCH 33.0 06/06/2021 1015  ? MCHC 34.7 06/06/2021 1015  ? RDW 12.8 06/06/2021 1015  ? LYMPHSABS 2.6 10/14/2013 1719  ? MONOABS 0.6 10/14/2013 1719  ? EOSABS 0.0 10/14/2013 1719   ? BASOSABS 0.0 10/14/2013 1719  ? ? ?BMET ?   ?Component Value Date/Time  ? NA 134 (L) 06/06/2021 0253  ? K 3.2 (L) 06/06/2021 0253  ? CL 102 06/06/2021 0253  ? CO2 22 06/06/2021 0253  ? GLUCOSE 107 (H) 06/06/2021 0253  ? BUN 18 06/06/2021 0253  ? CREATININE 0.78 06/06/2021 1015  ? CALCIUM 8.7 (L) 06/06/2021 0253  ? GFRNONAA >60 06/06/2021 1015  ? GFRAA >60 06/20/2017 1502  ? ? ?COAGS: ?No results found for: INR, PROTIME ? ? 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  Left renal perfused via false lumen ? ? ?ASSESSMENT/PLAN: Brian is a 61 y.o. 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  Fortunately, symptoms have improved dramatically with the initiation of antiimpulse therapy.  Brian Cannon is currently pain-free. ? 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 Aortic true lumen appears to feed the celiac, SMA, right renal.  Left renal perfused via false lumen.  ? ?Brian Cannon had an in-depth discussion with both Brian Cannon and his significant other.  Brian Cannon am hopeful at Brian point that we will be able to treat him with medical management.  Should Brian fail, Brian Cannon will need an extensive aortic repair, possible open chest with aortic arch deep branching, TEVAR.  ?At Brian time, Brian Cannon is best served with continued and impulse control.  Clears, strict Brian Cannon's and O's, bedrest, q6h CBC, CMP, lactate for the first 24h, repeat imaging in 48 hours. ?Brian Cannon appreciate the ICU's involvement, and Brian Cannon am available should questions or concerns arise. ? ? ?J. Gillis Santa MD MS ?[facility] ?[phone] ?06/06/2021  ?10:50 AM ? ?

## 2021-06-06 NOTE — ED Provider Notes (Signed)
?[facility]-EMERGENCY DEPT ?Provider Note ? ?CSN: [id number] ?Arrival date & time: 06/06/21 0241 ? ?Chief Complaint(s) ?Abdominal Pain ? ?HPI ?Brian Cannon is a 61 y.o. male   ? ? ?Abdominal Pain ?Pain location:  Epigastric ?Pain quality: sharp and stabbing   ?Pain radiates to:  Back ?Pain severity:  Moderate ?Onset quality:  Sudden ?Duration:  5 hours ?Timing:  Constant ?Progression:  Waxing and waning ?Chronicity:  New ?Context: not alcohol use and not suspicious food intake   ?Relieved by:  Vomiting ?Worsened by:  Nothing ?Associated symptoms: no chest pain, no cough, no fever and no hematemesis   ?Risk factors: no alcohol abuse   ? ?Past Medical History ?Past Medical History:  ?Diagnosis Date  ? Back pain   ? ?Patient Active Problem List  ? Diagnosis Date Noted  ? 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  ? Essential hypertension 06/06/2021  ? Hyponatremia 06/06/2021  ? Hypokalemia 06/06/2021  ? ?Home Medication(s) ?Prior to Admission medications   ?Medication Sig Start Date End Date Taking? Authorizing Provider  ?ciprofloxacin (CIPRO) 500 MG tablet Take 1 tablet (500 mg total) by mouth 2 (two) times daily. 06/20/17   Joy, Shawn C, PA-C  ?HYDROcodone-acetaminophen (NORCO/VICODIN) 5-325 MG tablet Take 1-2 tablets by mouth every 4 (four) hours as needed for severe pain. 06/20/17   Joy, Shawn C, PA-C  ?ondansetron (ZOFRAN ODT) 4 MG disintegrating tablet Take 1 tablet (4 mg total) by mouth every 8 (eight) hours as needed for nausea or vomiting. 06/20/17   Joy, Hillard Danker, PA-C  ?                                                                                                                                  ?Allergies ?Patient has no known allergies. ? ?Review of Systems ?Review of Systems  ?Constitutional:  Negative for fever.  ?Respiratory:  Negative for cough.   ?Cardiovascular:  Negative for chest pain.  ?Gastrointestinal:  Positive for abdominal pain. Negative for hematemesis.  ?As noted  in HPI ? ?Physical Exam ?Vital Signs  ?I have reviewed the triage vital signs ?BP (!) 160/84   Pulse 84   Temp 97.6 ?F (36.4 ?C) (Oral)   Resp 17   SpO2 99%  ? ?Physical Exam ?Vitals reviewed.  ?Constitutional:   ?   General: He is not in acute distress. ?   Appearance: He is well-developed. He is not diaphoretic.  ?HENT:  ?   Head: Normocephalic and atraumatic.  ?   Right Ear: External ear normal.  ?   Left Ear: External ear normal.  ?   Nose: Nose normal.  ?   Mouth/Throat:  ?   Mouth: Mucous membranes are moist.  ?Eyes:  ?   General: No scleral icterus. ?   Conjunctiva/sclera: Conjunctivae normal.  ?Neck:  ?   Trachea: Phonation normal.  ?Cardiovascular:  ?   Rate and Rhythm: Normal rate and regular  rhythm.  ?Pulmonary:  ?   Effort: Pulmonary effort is normal. No respiratory distress.  ?   Breath sounds: No stridor.  ?Abdominal:  ?   General: There is no distension.  ?   Tenderness: There is abdominal tenderness in the right upper quadrant and epigastric area. There is no guarding or rebound. Negative signs include Murphy's sign.  ?Musculoskeletal:     ?   General: Normal range of motion.  ?   Cervical back: Normal range of motion.  ?Neurological:  ?   Mental Status: He is alert and oriented to person, place, and time.  ?Psychiatric:     ?   Behavior: Behavior normal.  ? ? ?ED Results and Treatments ?Labs ?(all labs ordered are listed, but only abnormal results are displayed) ?Labs Reviewed  ?COMPREHENSIVE METABOLIC PANEL - Abnormal; Notable for the following components:  ?    Result Value  ? Sodium 134 (*)   ? Potassium 3.2 (*)   ? Glucose, Bld 107 (*)   ? Calcium 8.7 (*)   ? All other components within normal limits  ?CBC - Abnormal; Notable for the following components:  ? RBC 4.09 (*)   ? HCT 38.5 (*)   ? All other components within normal limits  ?LIPASE, BLOOD  ?                                                                                                                       ?EKG ? EKG  Interpretation ? ?Date/Time:    ?Ventricular Rate:    ?PR Interval:    ?QRS Duration:   ?QT Interval:    ?QTC Calculation:   ?R Axis:     ?Text Interpretation:   ?  ? ?  ? ?Radiology ?CT ABDOMEN PELVIS W CONTRAST ? ?Result Date: 06/06/2021 ?CLINICAL DATA:  Bowel obstruction suspected. EXAM: CT ABDOMEN AND PELVIS WITH CONTRAST TECHNIQUE: Multidetector CT imaging of the abdomen and pelvis was performed using the standard protocol following bolus administration of intravenous contrast. RADIATION DOSE REDUCTION: This exam was performed according to the departmental dose-optimization program which includes automated exposure control, adjustment of the mA and/or kV according to patient size and/or use of iterative reconstruction technique. CONTRAST:  100mL OMNIPAQUE IOHEXOL 300 MG/ML  SOLN COMPARISON:  06/20/2017 FINDINGS: Lower chest: Descending thoracic aortic dissection which is interval. No dilatation of the covered aorta. Fairly symmetric enhancement of the lumens. Hepatobiliary: Scattered small cystic densities in the liver.No evidence of biliary obstruction or stone. Pancreas: Unremarkable. Spleen: Unremarkable. Adrenals/Urinary Tract: Negative adrenals. No hydronephrosis or stone. Left renal cysts measuring up to 3.6 cm. Unremarkable bladder. Stomach/Bowel: No visible bowel obstruction or ischemia. Negative appendix. Vascular/Lymphatic: The aortic dissection continues down the abdominal aorta with fairly symmetric lumen enhancement. The flap appears to terminate in the distal aorta where there is bulky calcified plaque. At least moderate narrowing at the left common carotid origin. The distal aortic and proximal iliac wall appears thickened circumferentially when compared to prior. No  noted rupture or aneurysm. No mass or adenopathy. Reproductive:Bilateral varicocele appearance with probable right partially covered hydrocele. Other: No ascites or pneumoperitoneum Musculoskeletal: No acute finding.  Lower lumbar  spine degeneration. Critical Value/emergent results were called by telephone at the time of interpretation on 06/06/2021 at 5:39 am to provider Gracemarie Skeet. IMPRESSION: 1. Aortic dissection since 2019 comparison beginning in the chest (origin not covered) and terminating near the aortic bifurcation. Fairly symmetric enhancing lumens and visceral branches. There is inflammatory type wall thickening of the distal aorta and proximal iliacs since 2019. 2. Stenotic left iliac artery origin. Electronically Signed   By: Tiburcio Pea M.D.   On: 06/06/2021 05:38  ? ?CT Angio Chest/Abd/Pel for Dissection W and/or Wo Contrast ? ?Result Date: 06/06/2021 ?CLINICAL DATA:  Aortic dissection evaluation EXAM: CT ANGIOGRAPHY CHEST, ABDOMEN AND PELVIS TECHNIQUE: Non-contrast CT of the chest was initially obtained. Multidetector CT imaging through the chest, abdomen and pelvis was performed using the standard protocol during bolus administration of intravenous contrast. Multiplanar reconstructed images and MIPs were obtained and reviewed to evaluate the vascular anatomy. RADIATION DOSE REDUCTION: This exam was performed according to the departmental dose-optimization program which includes automated exposure control, adjustment of the mA and/or kV according to patient size and/or use of iterative reconstruction technique. CONTRAST:  21mL OMNIPAQUE IOHEXOL 350 MG/ML SOLN COMPARISON:  Abdominal CT from earlier the same day FINDINGS: CTA CHEST FINDINGS Cardiovascular:  No aortic intramural hematoma. Normal heart size. No pericardial effusion. Atheromatous calcification along the LAD. Aortic dissection beginning at the isthmus (just beyond the left subclavian origin) with broad defect in the dissection flap and symmetric luminal enhancement. The isthmic aorta measures up to 3.5 cm diameter. Mediastinum/Nodes: No hematoma or pneumomediastinum. Lungs/Pleura: Paraseptal emphysema and centrilobular emphysema at the apices. Dependent  atelectasis. There is no edema, consolidation, effusion, or pneumothorax. Musculoskeletal: No acute finding Review of the MIP images confirms the above findings. CTA ABDOMEN AND PELVIS FINDINGS VASCULAR Aorta: Known aortic diss

## 2021-06-07 ENCOUNTER — Inpatient Hospital Stay (HOSPITAL_COMMUNITY): Payer: Self-pay

## 2021-06-07 DIAGNOSIS — R0609 Other forms of dyspnea: Secondary | ICD-10-CM

## 2021-06-07 LAB — CBC
HCT: 37.4 % — ABNORMAL LOW (ref 39.0–52.0)
HCT: 36.3 % — ABNORMAL LOW (ref 39.0–52.0)
HCT: 35.8 % — ABNORMAL LOW (ref 39.0–52.0)
Hemoglobin: 12.8 g/dL — ABNORMAL LOW (ref 13.0–17.0)
Hemoglobin: 12.6 g/dL — ABNORMAL LOW (ref 13.0–17.0)
Hemoglobin: 12.3 g/dL — ABNORMAL LOW (ref 13.0–17.0)
MCH: 33.5 pg (ref 26.0–34.0)
MCH: 33.7 pg (ref 26.0–34.0)
MCH: 33.3 pg (ref 26.0–34.0)
MCHC: 34.2 g/dL (ref 30.0–36.0)
MCHC: 34.7 g/dL (ref 30.0–36.0)
MCHC: 34.4 g/dL (ref 30.0–36.0)
MCV: 97.9 fL (ref 80.0–100.0)
MCV: 97.1 fL (ref 80.0–100.0)
MCV: 97 fL (ref 80.0–100.0)
Platelets: 173 10*3/uL (ref 150–400)
Platelets: 168 10*3/uL (ref 150–400)
Platelets: 166 10*3/uL (ref 150–400)
RBC: 3.82 MIL/uL — ABNORMAL LOW (ref 4.22–5.81)
RBC: 3.74 MIL/uL — ABNORMAL LOW (ref 4.22–5.81)
RBC: 3.69 MIL/uL — ABNORMAL LOW (ref 4.22–5.81)
RDW: 13.2 % (ref 11.5–15.5)
RDW: 13.3 % (ref 11.5–15.5)
RDW: 13.3 % (ref 11.5–15.5)
WBC: 7.2 10*3/uL (ref 4.0–10.5)
WBC: 6.9 10*3/uL (ref 4.0–10.5)
WBC: 6.5 10*3/uL (ref 4.0–10.5)
nRBC: 0 % (ref 0.0–0.2)
nRBC: 0 % (ref 0.0–0.2)
nRBC: 0 % (ref 0.0–0.2)

## 2021-06-07 LAB — BASIC METABOLIC PANEL
Anion gap: 7 (ref 5–15)
BUN: 10 mg/dL (ref 8–23)
CO2: 23 mmol/L (ref 22–32)
Calcium: 8.3 mg/dL — ABNORMAL LOW (ref 8.9–10.3)
Chloride: 107 mmol/L (ref 98–111)
Creatinine, Ser: 0.89 mg/dL (ref 0.61–1.24)
GFR, Estimated: >60 mL/min (ref >60)
Glucose, Bld: 93 mg/dL (ref 70–99)
Potassium: 3.7 mmol/L (ref 3.5–5.1)
Sodium: 137 mmol/L (ref 135–145)

## 2021-06-07 LAB — ECHOCARDIOGRAM COMPLETE
Area-P 1/2: 3.08 cm2
Calc EF: 53.2 %
Height: 74.016 in
S' Lateral: 3.7 cm
Single Plane A2C EF: 54.3 %
Single Plane A4C EF: 48.7 %
Weight: 2701.96 oz

## 2021-06-07 LAB — COMPREHENSIVE METABOLIC PANEL
ALT: 13 U/L (ref 0–44)
ALT: 13 U/L (ref 0–44)
AST: 15 U/L (ref 15–41)
AST: 15 U/L (ref 15–41)
Albumin: 3 g/dL — ABNORMAL LOW (ref 3.5–5.0)
Albumin: 3 g/dL — ABNORMAL LOW (ref 3.5–5.0)
Alkaline Phosphatase: 49 U/L (ref 38–126)
Alkaline Phosphatase: 48 U/L (ref 38–126)
Anion gap: 7 (ref 5–15)
Anion gap: 3 — ABNORMAL LOW (ref 5–15)
BUN: 8 mg/dL (ref 8–23)
BUN: 6 mg/dL — ABNORMAL LOW (ref 8–23)
CO2: 23 mmol/L (ref 22–32)
CO2: 22 mmol/L (ref 22–32)
Calcium: 8.2 mg/dL — ABNORMAL LOW (ref 8.9–10.3)
Calcium: 8.2 mg/dL — ABNORMAL LOW (ref 8.9–10.3)
Chloride: 106 mmol/L (ref 98–111)
Chloride: 110 mmol/L (ref 98–111)
Creatinine, Ser: 0.93 mg/dL (ref 0.61–1.24)
Creatinine, Ser: 0.9 mg/dL (ref 0.61–1.24)
GFR, Estimated: >60 mL/min (ref >60)
GFR, Estimated: >60 mL/min (ref >60)
Glucose, Bld: 105 mg/dL — ABNORMAL HIGH (ref 70–99)
Glucose, Bld: 79 mg/dL (ref 70–99)
Potassium: 3.6 mmol/L (ref 3.5–5.1)
Potassium: 3.5 mmol/L (ref 3.5–5.1)
Sodium: 136 mmol/L (ref 135–145)
Sodium: 135 mmol/L (ref 135–145)
Total Bilirubin: 0.8 mg/dL (ref 0.3–1.2)
Total Bilirubin: 0.7 mg/dL (ref 0.3–1.2)
Total Protein: 5.7 g/dL — ABNORMAL LOW (ref 6.5–8.1)
Total Protein: 5.9 g/dL — ABNORMAL LOW (ref 6.5–8.1)

## 2021-06-07 LAB — LACTIC ACID, PLASMA
Lactic Acid, Venous: 1 mmol/L (ref 0.5–1.9)
Lactic Acid, Venous: 0.9 mmol/L (ref 0.5–1.9)
Lactic Acid, Venous: 0.9 mmol/L (ref 0.5–1.9)

## 2021-06-07 NOTE — Progress Notes (Signed)
?PROGRESS NOTE ? ? ? ?Brian Cannon  [id number] DOB: October 07, 1960 DOA: 06/06/2021 ?PCP: Pcp, No ? ? ?Brief Narrative:  ?Brian Cannon is a 61 y.o. male past medical history of tobacco abuse for 40 years having abdominal pain 5 days prior to admission he relates pain which has progressively gotten worse is just constant radiates to his back a comfortable position he denies any fever cough or diarrhea. 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 ?Assessment & Plan: ?  ?Principal Problem: ?  Aortic dissection, abdominal (HCC) ?Active Problems: ?  Abdominal aortic aneurysm dissection (HCC) ?  Essential hypertension ?  Hyponatremia ?  Hypokalemia ?  Descending thoracic dissection ?  Thoracic aortic dissection ? ?Acute symptomatic uncomplicated thoracic and abdominal aortic aneurysm dissection, POA ?Vascular surgery following, appreciate insight and recommendations ?Repeat CTA planned for 06/08/2021 to further evaluate ?Blood pressure heart rate well controlled on current regimen ?Use IV Dilaudid for pain control. ? ?Essential hypertension ?Currently well controlled on IV labetalol. ?  ?Hypokalemia ?Replete as necessary, currently within normal limits, mag within normal limits  ?  ?DVT Prophylaxis: lovenox ?Code Status: Full  ?Family Communication: none  ? ?Status is: Inpatient ? ?Dispo: The patient is from: Home ?             Anticipated d/c is to: To be determined ?             Anticipated d/c date is: 48 to 72 hours ?             Patient currently not medically stable for discharge ? ?Consultants:  ?Vascular surgery ? ?Procedures:  ?None ? ?Antimicrobials:  ?None ? ?Subjective: ?No acute issues or events overnight, pain currently  well controlled ? ?Objective: ?Vitals:  ? 06/07/21 0430 06/07/21 0500 06/07/21 0600 06/07/21 0700  ?BP: 117/64 108/64 102/63 (!) 106/49  ?Pulse: 69 66 75 65  ?Resp: 11 (!) 8 14 (!) 9  ?Temp:      ?TempSrc:      ?SpO2: 90% 92% 92% 93%  ?Weight:  76.6 kg    ?Height:      ? ? ?Intake/Output Summary (Last 24 hours) at 06/07/2021 0725 ?Last data filed at 06/07/2021 0700 ?Gross per 24 hour  ?Intake 1440.74 ml  ?Output 775 ml  ?Net 665.74 ml  ? ?Filed Weights  ? 06/06/21 0930 06/07/21 0500  ?Weight: 77.1 kg 76.6 kg  ? ? ?Examination: ? ?General exam: Appears calm and comfortable  ?Respiratory system: Clear to auscultation. Respiratory effort normal. ?Cardiovascular system: S1 & S2 heard, RRR. No JVD, murmurs, rubs, gallops or clicks. No pedal edema. ?Gastrointestinal system: Abdomen is nondistended, soft and nontender. No organomegaly or masses felt. Normal bowel sounds heard. ?Central nervous system: Alert and oriented. No focal neurological deficits. ?Extremities: Symmetric 5 x 5 power. ?Skin: No rashes, lesions or ulcers ?Psychiatry: Judgement and insight appear normal. Mood & affect appropriate.  ? ? ? ?Data Reviewed: I have personally reviewed following labs and imaging studies ? ?CBC: ?Recent Labs  ?Lab 06/06/21 ?0253 06/06/21 ?1015 06/06/21 ?1523 06/06/21 ?2100 06/07/21 ?0205  ?WBC 8.1 8.2 8.2 7.2 7.2  ?HGB 13.8 13.7 13.4 13.5 12.8*  ?HCT 38.5* 39.5 37.9* 38.1* 37.4*  ?MCV  94.1 95.2 95.7 95.5 97.9  ?PLT 197 200 181 176 173  ? ?Basic Metabolic Panel: ?Recent Labs  ?Lab 06/06/21 ?0253 06/06/21 ?[id number] 06/06/21 ?1015 06/06/21 ?1523 06/06/21 ?2100 06/07/21 ?0159  ?NA 134*  --   --  136 137 137  ?K 3.2*  --   --  4.1 3.3* 3.7  ?CL 102  --   --  106 106 107  ?CO2 22  --   --  23 23 23   ?GLUCOSE 107*  --   --  105* 89 93  ?BUN 18  --   --  11 10 10   ?CREATININE 0.91  --  0.78 0.87 0.93 0.89  ?CALCIUM 8.7*  --   --  8.4* 8.3* 8.3*  ?MG  --  1.7  --   --   --   --   ? ?GFR: ?Estimated Creatinine Clearance: 94.4 mL/min (by C-G  formula based on SCr of 0.89 mg/dL). ?Liver Function Tests: ?Recent Labs  ?Lab 06/06/21 ?0253 06/06/21 ?1523  ?AST 25 17  ?ALT 17 16  ?ALKPHOS 56 52  ?BILITOT 0.6 0.9  ?PROT 7.4 6.1*  ?ALBUMIN 4.1 3.3*  ? ?Recent Labs  ?Lab 06/06/21 ?0253  ?LIPASE 30  ? ?No results for input(s): AMMONIA in the last 168 hours. ?Coagulation Profile: ?Recent Labs  ?Lab 06/06/21 ?1015  ?INR 1.0  ? ?Cardiac Enzymes: ?No results for input(s): CKTOTAL, CKMB, CKMBINDEX, TROPONINI in the last 168 hours. ?BNP (last 3 results) ?No results for input(s): PROBNP in the last 8760 hours. ?HbA1C: ?No results for input(s): HGBA1C in the last 72 hours. ?CBG: ?No results for input(s): GLUCAP in the last 168 hours. ?Lipid Profile: ?No results for input(s): CHOL, HDL, LDLCALC, TRIG, CHOLHDL, LDLDIRECT in the last 72 hours. ?Thyroid Function Tests: ?No results for input(s): TSH, T4TOTAL, FREET4, T3FREE, THYROIDAB in the last 72 hours. ?Anemia Panel: ?No results for input(s): VITAMINB12, FOLATE, FERRITIN, TIBC, IRON, RETICCTPCT in the last 72 hours. ?Sepsis Labs: ?Recent Labs  ?Lab 06/06/21 ?1523 06/06/21 ?2100 06/07/21 ?0344  ?LATICACIDVEN 1.4 1.1 1.0  ? ? ?Recent Results (from the past 240 hour(s))  ?Surgical pcr screen     Status: None  ? Collection Time: 06/06/21  9:39 AM  ? Specimen: Nasal Mucosa; Nasal Swab  ?Result Value Ref Range Status  ? MRSA, PCR NEGATIVE NEGATIVE Final  ? Staphylococcus aureus NEGATIVE NEGATIVE Final  ?  Comment: (NOTE) ?The Xpert SA Assay (FDA approved for NASAL specimens in patients 2822 ?years of age and older), is one component of a comprehensive ?surveillance program. It is not intended to diagnose infection nor to ?guide or monitor treatment. ?Performed at [facility], [address] [place], [place] ?[id number] ?  ?  ? ? ? ? ? ?Radiology Studies: ?CT ABDOMEN PELVIS W CONTRAST ? ?Result Date: 06/06/2021 ?CLINICAL DATA:  Bowel obstruction suspected. EXAM: CT ABDOMEN AND PELVIS WITH CONTRAST TECHNIQUE: Multidetector CT  imaging of the abdomen and pelvis was performed using the standard protocol following bolus administration of intravenous contrast. RADIATION DOSE REDUCTION: This exam was performed according to the departmental dose-optimization program which includes automated exposure control, adjustment of the mA and/or kV according to patient size and/or use of iterative reconstruction technique. CONTRAST:  100mL OMNIPAQUE IOHEXOL 300 MG/ML  SOLN COMPARISON:  06/20/2017 FINDINGS: Lower chest: Descending thoracic aortic dissection which is interval. No dilatation of the covered aorta. Fairly symmetric enhancement of the lumens. Hepatobiliary: Scattered small cystic densities in the liver.No evidence of biliary obstruction or  stone. Pancreas: Unremarkable. Spleen: Unremarkable. Adrenals/Urinary Tract: Negative adrenals. No hydronephrosis or stone. Left renal cysts measuring up to 3.6 cm. Unremarkable bladder. Stomach/Bowel: No visible bowel obstruction or ischemia. Negative appendix. Vascular/Lymphatic: The aortic dissection continues down the abdominal aorta with fairly symmetric lumen enhancement. The flap appears to terminate in the distal aorta where there is bulky calcified plaque. At least moderate narrowing at the left common carotid origin. The distal aortic and proximal iliac wall appears thickened circumferentially when compared to prior. No noted rupture or aneurysm. No mass or adenopathy. Reproductive:Bilateral varicocele appearance with probable right partially covered hydrocele. Other: No ascites or pneumoperitoneum Musculoskeletal: No acute finding.  Lower lumbar spine degeneration. Critical Value/emergent results were called by telephone at the time of interpretation on 06/06/2021 at 5:39 am to provider PEDRO CARDAMA. IMPRESSION: 1. Aortic dissection since 2019 comparison beginning in the chest (origin not covered) and terminating near the aortic bifurcation. Fairly symmetric enhancing lumens and visceral branches.  There is inflammatory type wall thickening of the distal aorta and proximal iliacs since 2019. 2. Stenotic left iliac artery origin. Electronically Signed   By: Tiburcio Pea M.D.   On: 06/06/2021 05:3

## 2021-06-07 NOTE — Progress Notes (Signed)
?  Echocardiogram ?2D Echocardiogram has been performed. ? ?Augustine Radar ?06/07/2021, 10:01 AM ?

## 2021-06-07 NOTE — Progress Notes (Signed)
?  Daily Progress Note ? ?Acute TBAD ? ?Subjective: ?Feels much better than yesterday.  Some pain in the abdomen that he says is limited to his rectus muscles after multiple bouts of vomiting. ? ?Objective: ?Vitals:  ? 06/07/21 0830 06/07/21 0900  ?BP: 136/71 112/73  ?Pulse: 77 77  ?Resp: 14 15  ?Temp:    ?SpO2: 94% 97%  ?  ?Physical Examination ?Abdomen soft ?Palpable pulses in the feet and wrists ?Regular rate ?Normal rhythm ? ?ASSESSMENT/PLAN:  ?This is a 61 y.o. 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  Fortunately, symptoms have improved dramatically with the initiation of antiimpulse therapy.  He is currently pain-free. ?  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 Aortic true lumen appears to feed the celiac, SMA, right renal.  Left renal perfused via false lumen.  ? ?Recommend 24 hours of continued IV anti-impulse control. ?SBP<120, HR <80 ?CT angio chest abdomen pelvis tomorrow ?Bedrest ?Continue strict I's and O's ?Can have a diet this morning ? ? ? ?J. Gillis Santa MD MS ?[facility] ?[phone] ?06/07/2021  ?9:42 AM ? ?

## 2021-06-07 NOTE — Plan of Care (Signed)

## 2021-06-08 ENCOUNTER — Inpatient Hospital Stay (HOSPITAL_COMMUNITY): Payer: Self-pay

## 2021-06-08 LAB — CBC
HCT: 34.8 % — ABNORMAL LOW (ref 39.0–52.0)
Hemoglobin: 12.3 g/dL — ABNORMAL LOW (ref 13.0–17.0)
MCH: 34 pg (ref 26.0–34.0)
MCHC: 35.3 g/dL (ref 30.0–36.0)
MCV: 96.1 fL (ref 80.0–100.0)
Platelets: 171 10*3/uL (ref 150–400)
RBC: 3.62 MIL/uL — ABNORMAL LOW (ref 4.22–5.81)
RDW: 13.2 % (ref 11.5–15.5)
WBC: 7.5 10*3/uL (ref 4.0–10.5)
nRBC: 0 % (ref 0.0–0.2)

## 2021-06-08 LAB — BASIC METABOLIC PANEL
Anion gap: 5 (ref 5–15)
BUN: 5 mg/dL — ABNORMAL LOW (ref 8–23)
CO2: 23 mmol/L (ref 22–32)
Calcium: 8.4 mg/dL — ABNORMAL LOW (ref 8.9–10.3)
Chloride: 109 mmol/L (ref 98–111)
Creatinine, Ser: 0.92 mg/dL (ref 0.61–1.24)
GFR, Estimated: >60 mL/min (ref >60)
Glucose, Bld: 86 mg/dL (ref 70–99)
Potassium: 3.5 mmol/L (ref 3.5–5.1)
Sodium: 137 mmol/L (ref 135–145)

## 2021-06-08 MED ORDER — HYDROMORPHONE HCL 1 MG/ML IJ SOLN
0.5000 mg | INTRAMUSCULAR | Status: AC
  Administered 2021-06-08: 0.5 mg via INTRAVENOUS
  Filled 2021-06-08: qty 0.5

## 2021-06-08 MED ORDER — NITROGLYCERIN IN D5W 200-5 MCG/ML-% IV SOLN
0.0000 ug/min | INTRAVENOUS | Status: DC
  Administered 2021-06-08: 5 ug/min via INTRAVENOUS
  Filled 2021-06-08: qty 250

## 2021-06-08 MED ORDER — IOHEXOL 350 MG/ML SOLN
100.0000 mL | Freq: Once | INTRAVENOUS | Status: AC | PRN
  Administered 2021-06-08: 100 mL via INTRAVENOUS

## 2021-06-08 MED ORDER — METOPROLOL TARTRATE 5 MG/5ML IV SOLN
2.5000 mg | INTRAVENOUS | Status: AC
  Administered 2021-06-08: 2.5 mg via INTRAVENOUS
  Filled 2021-06-08: qty 5

## 2021-06-08 MED ORDER — AMLODIPINE BESYLATE 10 MG PO TABS
10.0000 mg | ORAL_TABLET | Freq: Every day | ORAL | Status: DC
  Administered 2021-06-08 – 2021-06-11 (×4): 10 mg via ORAL
  Filled 2021-06-08 (×4): qty 1

## 2021-06-08 MED ORDER — HYDRALAZINE HCL 20 MG/ML IJ SOLN: 10.0000 mg | INTRAMUSCULAR | Status: DC | PRN

## 2021-06-08 MED ORDER — CARVEDILOL 6.25 MG PO TABS
6.2500 mg | ORAL_TABLET | Freq: Two times a day (BID) | ORAL | Status: DC
  Filled 2021-06-08: qty 1

## 2021-06-08 MED ORDER — CARVEDILOL 6.25 MG PO TABS
6.2500 mg | ORAL_TABLET | Freq: Two times a day (BID) | ORAL | Status: DC
  Administered 2021-06-08 (×2): 6.25 mg via ORAL
  Filled 2021-06-08 (×2): qty 1

## 2021-06-08 MED ORDER — LISINOPRIL 20 MG PO TABS
20.0000 mg | ORAL_TABLET | Freq: Every day | ORAL | Status: DC
  Administered 2021-06-08: 20 mg via ORAL
  Filled 2021-06-08: qty 1

## 2021-06-08 MED ORDER — CARVEDILOL 12.5 MG PO TABS: 12.5000 mg | ORAL_TABLET | Freq: Two times a day (BID) | ORAL | Status: DC

## 2021-06-08 MED ORDER — ESMOLOL HCL-SODIUM CHLORIDE 2000 MG/100ML IV SOLN
25.0000 ug/kg/min | INTRAVENOUS | Status: DC
  Administered 2021-06-08: 250 ug/kg/min via INTRAVENOUS
  Administered 2021-06-08: 300 ug/kg/min via INTRAVENOUS
  Administered 2021-06-08: 250 ug/kg/min via INTRAVENOUS
  Administered 2021-06-08: 300 ug/kg/min via INTRAVENOUS
  Administered 2021-06-08: 275 ug/kg/min via INTRAVENOUS
  Administered 2021-06-08 (×2): 300 ug/kg/min via INTRAVENOUS
  Administered 2021-06-08: 50 ug/kg/min via INTRAVENOUS
  Administered 2021-06-08: 25 ug/kg/min via INTRAVENOUS
  Administered 2021-06-08: 300 ug/kg/min via INTRAVENOUS
  Administered 2021-06-08: 250 ug/kg/min via INTRAVENOUS
  Administered 2021-06-08: 275 ug/kg/min via INTRAVENOUS
  Administered 2021-06-08: 300 ug/kg/min via INTRAVENOUS
  Administered 2021-06-09: 75 ug/kg/min via INTRAVENOUS
  Administered 2021-06-09: 125 ug/kg/min via INTRAVENOUS
  Filled 2021-06-08 (×19): qty 100

## 2021-06-08 NOTE — TOC Initial Note (Signed)
Transition of Care (TOC) - Initial/Assessment Note  ? ? ?Patient Details  ?Name: Brian Cannon ?MRN: [id number] ?Date of Birth: 21-Jun-1960 ? ?Transition of Care (TOC) CM/SW Contact:    ?Graves-Bigelow, Ocie Cornfield, RN ?Phone Number: ?06/08/2021, 3:51 PM ? ?Clinical Narrative: Patient was discussed in progression rounds this morning. Patient states he has affordable care insurance; however, has not received a card. Case Manager discussed with the patient that he should have emails regarding the ID number. Patient states that the insurance is helping him secure a PCP. Case Manager will continue to follow for disposition needs.                  ? ? ?Expected Discharge Plan: Home/Self Care ?Barriers to Discharge: Continued Medical Work up ? ? ?Patient Goals and CMS Choice ?Patient states their goals for this hospitalization and ongoing recovery are:: to return home once stable. ?  ?  ? ?Expected Discharge Plan and Services ?Expected Discharge Plan: Home/Self Care ?In-house Referral: NA ?Discharge Planning Services: CM Consult ?Post Acute Care Choice: NA ?Living arrangements for the past 2 months: [place] ?                ?  ?DME Agency: NA ?  ?  ? ?Prior Living Arrangements/Services ?Living arrangements for the past 2 months: [place] ?Lives with:: Self ?Patient language and need for interpreter reviewed:: Yes ?Do you feel safe going back to the place where you live?: Yes      ?Need for Family Participation in Patient Care: Yes (Comment) ?Care giver support system in place?: Yes (comment) ?  ?Criminal Activity/Legal Involvement Pertinent to Current Situation/Hospitalization: No - Comment as needed ? ?Activities of Daily Living ?  ?  ? ?Permission Sought/Granted ?Permission sought to share information with : Family Supports, Customer service manager, Case Manager ?Permission granted to share information with : Yes, Verbal Permission Granted ?   ?   ?   ?   ? ?Emotional Assessment ?Appearance::  Appears stated age ?Attitude/Demeanor/Rapport: Engaged ?Affect (typically observed): Appropriate ?Orientation: : Oriented to Situation, Oriented to  Time, Oriented to Place, Oriented to Self ?Alcohol / Substance Use: Not Applicable ?Psych Involvement: No (comment) ? ?Admission diagnosis:  Aortic dissection, abdominal ([place]) [I71.02] ?Dissection of thoracoabdominal aorta ([place]) [I71.03] ?Thoracic aortic dissection [I71.019] ?Patient Active Problem List  ? Diagnosis Date Noted  ? Abdominal aortic aneurysm dissection ([place]) 06/06/2021  ? Essential hypertension 06/06/2021  ? Hyponatremia 06/06/2021  ? Hypokalemia 06/06/2021  ? Descending thoracic dissection 06/06/2021  ? Aortic dissection, abdominal ([place]) 06/06/2021  ? Thoracic aortic dissection 06/06/2021  ? ?PCP:  Pcp, No ?Pharmacy:   ?[id number] [address], [place] ?[place] ?[place] [address] ?Phone: [phone] Fax: [phone] ? ? ? ?Readmission Risk Interventions ?   ? View : No data to display.  ?  ?  ?  ? ? ? ?

## 2021-06-08 NOTE — Progress Notes (Addendum)
?  Progress Note ? ? ? ?06/08/2021 ?6:49 AM ?Hospital Day 2 ? ?Subjective:  says he feels much better today. Denies any chest pain.  Says he has some abdominal soreness from vomiting but this is resolved.  He states he is also sore from being in the bed.  ? ?Afebrile ?HR 70's-90's NSR ?[id number] systolic ?0000000 RA ? ?Gttts:  esmolol ? ?Vitals:  ? 06/08/21 0615 06/08/21 0630  ?BP: (!) 104/54 (!) 106/56  ?Pulse: 81 87  ?Resp: 15 13  ?Temp:    ?SpO2: 93% 96%  ? ? ?Physical Exam: ?Cardiac:  regular ?Lungs:  non labored ?Extremities:  right DP is palpable.  Unable to palpate left pedal pulse but left femoral pulse is 2+.  His feet are warm and well perfused bilaterally. ? ?CBC ?   ?Component Value Date/Time  ? WBC 7.5 06/08/2021 0205  ? RBC 3.62 (L) 06/08/2021 0205  ? HGB 12.3 (L) 06/08/2021 0205  ? HCT 34.8 (L) 06/08/2021 0205  ? PLT 171 06/08/2021 0205  ? MCV 96.1 06/08/2021 0205  ? MCH 34.0 06/08/2021 0205  ? MCHC 35.3 06/08/2021 0205  ? RDW 13.2 06/08/2021 0205  ? LYMPHSABS 2.6 10/14/2013 1719  ? MONOABS 0.6 10/14/2013 1719  ? EOSABS 0.0 10/14/2013 1719  ? BASOSABS 0.0 10/14/2013 1719  ? ? ?BMET ?   ?Component Value Date/Time  ? NA 137 06/08/2021 0205  ? K 3.5 06/08/2021 0205  ? CL 109 06/08/2021 0205  ? CO2 23 06/08/2021 0205  ? GLUCOSE 86 06/08/2021 0205  ? BUN 5 (L) 06/08/2021 0205  ? CREATININE 0.92 06/08/2021 0205  ? CALCIUM 8.4 (L) 06/08/2021 0205  ? GFRNONAA >60 06/08/2021 0205  ? GFRAA >60 06/20/2017 1502  ? ? ?INR ?   ?Component Value Date/Time  ? INR 1.0 06/06/2021 1015  ? ? ? ?Intake/Output Summary (Last 24 hours) at 06/08/2021 0649 ?Last data filed at 06/08/2021 0600 ?Gross per 24 hour  ?Intake 1421.46 ml  ?Output 1725 ml  ?Net -303.54 ml  ? ? ? ?Assessment/Plan:  61 y.o. male with acute, uncomplicated type B aortic dissection Hospital Day 2 ? ?-still requiring esmolol gtt for BP control.  Transition to antihypertensive po meds per primary team ?-good blood pressure control with esmolol with systolic  [id number].   ?-hgb stable ?-probably ok to get out of bed today-will d/w Dr. Virl Cagey.  ?-pt for CTA c/a/p today ? ? ?Leontine Locket, PA-C ?[facility] ?[id number] ?06/08/2021 ?6:49 AM ? ?VASCULAR STAFF ADDENDUM: ?I have independently interviewed and examined the patient. ?I agree with the above.  ?CT examined, no significant interval change. Can eat. Can work to transition to Oral antiHTN meds. ? ?J. Melene Muller, MD ?[facility] ?Office Phone Number: [phone] ?06/08/2021 5:35 PM ? ? ?

## 2021-06-08 NOTE — Progress Notes (Signed)
?PROGRESS NOTE ? ? ? ?Brian Cannon  WJX:[id number] DOB: [birth year] DOA: 06/06/2021 ?PCP: Pcp, No ? ? ?Brief Narrative:  ?Brian Cannon is a 61 y.o. male past medical history of tobacco abuse for 40 years having abdominal pain 5 days prior to admission he relates pain which has progressively gotten worse is just constant radiates to his back a comfortable position he denies any fever cough or diarrhea.  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  Vascular consulted at intake, hospitalist called for admission. ? ?Assessment & Plan: ?  ?Principal Problem: ?  Aortic dissection, abdominal (HCC) ?Active Problems: ?  Abdominal aortic aneurysm dissection (HCC) ?  Essential hypertension ?  Hyponatremia ?  Hypokalemia ?  Descending thoracic dissection ?  Thoracic aortic dissection ? ?Acute symptomatic uncomplicated thoracic and abdominal aortic aneurysm dissection, POA ?Vascular surgery following, appreciate insight and recommendations:SBP<120, HR <80 ?Repeat CTA planned for 06/08/2021 to further evaluate ?Increase carvedilol, follow esmolol drip, will likely uptitrate carvedilol, add amlodipine versus ACE/ARB over the next 12 to 24 hours pending blood pressure control as esmolol is weaned down ?Continue pain management with hydrocodone, Dilaudid ? ?Essential hypertension ?Continue to uptitrate carvedilol/etc. as above ?  ?Hypokalemia ?Replete as necessary, currently within normal limits, mag within normal limits  ?  ?DVT Prophylaxis: lovenox ?Code Status: Full  ?Family Communication: none  ? ?Status is: Inpatient ? ?Dispo: The patient is from: Home ?             Anticipated d/c is to: To be determined ?             Anticipated d/c date is: 24-48 hours ?             Patient currently not  medically stable for discharge ? ?Consultants:  ?Vascular surgery ? ?Procedures:  ?None ? ?Antimicrobials:  ?None ? ?Subjective: ?No acute issues or events overnight, pain currently well controlled ? ?Objective: ?Vitals:  ? 06/08/21 0615 06/08/21 0630 06/08/21 0645 06/08/21 0700  ?BP: (!) 104/54 (!) 106/56 (!) 87/35 (!) 106/58  ?Pulse: 81 87 79 81  ?Resp: 15 13 14 13   ?Temp:    99.4 ?F (37.4 ?C)  ?TempSrc:    Oral  ?SpO2: 93% 96% 92% 96%  ?Weight:      ?Height:      ? ? ?Intake/Output Summary (Last 24 hours) at 06/08/2021 0752 ?Last data filed at 06/08/2021 0700 ?Gross per 24 hour  ?Intake 1415.52 ml  ?Output 1725 ml  ?Net -309.48 ml  ? ? ?Filed Weights  ? 06/06/21 0930 06/07/21 0500 06/08/21 0500  ?Weight: 77.1 kg 76.6 kg 77.1 kg  ? ? ?Examination: ? ?General exam: Appears calm and comfortable  ?Respiratory system: Clear to auscultation. Respiratory effort normal. ?Cardiovascular system: S1 & S2 heard, RRR. No JVD, murmurs, rubs, gallops or clicks. No pedal edema. ?Gastrointestinal system: Abdomen is nondistended, soft and nontender. No organomegaly or masses felt. Normal bowel sounds heard. ?Central nervous system: Alert and oriented. No focal neurological deficits. ?Extremities: Symmetric 5 x 5 power. ?Skin: No rashes, lesions or ulcers ?Psychiatry: Judgement and insight appear normal. Mood & affect appropriate.  ? ?Data Reviewed: I have personally reviewed following labs and imaging studies ? ?CBC: ?Recent Labs  ?Lab 06/06/21 ?2100 06/07/21 ?0205 06/07/21 ?1024 06/07/21 ?  1610 06/08/21 ?0205  ?WBC 7.2 7.2 6.9 6.5 7.5  ?HGB 13.5 12.8* 12.6* 12.3* 12.3*  ?HCT 38.1* 37.4* 36.3* 35.8* 34.8*  ?MCV 95.5 97.9 97.1 97.0 96.1  ?PLT 176 173 168 166 171  ? ? ?Basic Metabolic Panel: ?Recent Labs  ?Lab 06/06/21 ?1245 06/06/21 ?1015 06/06/21 ?2100 06/07/21 ?0159 06/07/21 ?1024 06/07/21 ?1610 06/08/21 ?0205  ?NA  --    < > 137 137 136 135 137  ?K  --    < > 3.3* 3.7 3.6 3.5 3.5  ?CL  --    < > 106 107 106 110 109  ?CO2  --    < >  23 23 23 22 23   ?GLUCOSE  --    < > 89 93 105* 79 86  ?BUN  --    < > 10 10 8  6* 5*  ?CREATININE  --    < > 0.93 0.89 0.93 0.90 0.92  ?CALCIUM  --    < > 8.3* 8.3* 8.2* 8.2* 8.4*  ?MG 1.7  --   --   --   --   --   --   ? < > = values in this interval not displayed.  ? ? ?GFR: ?Estimated Creatinine Clearance: 92 mL/min (by C-G formula based on SCr of 0.92 mg/dL). ?Liver Function Tests: ?Recent Labs  ?Lab 06/06/21 ?0253 06/06/21 ?1523 06/07/21 ?1024 06/07/21 ?1610  ?AST 25 17 15 15   ?ALT 17 16 13 13   ?ALKPHOS 56 52 49 48  ?BILITOT 0.6 0.9 0.8 0.7  ?PROT 7.4 6.1* 5.7* 5.9*  ?ALBUMIN 4.1 3.3* 3.0* 3.0*  ? ? ?Recent Labs  ?Lab 06/06/21 ?0253  ?LIPASE 30  ? ? ?No results for input(s): AMMONIA in the last 168 hours. ?Coagulation Profile: ?Recent Labs  ?Lab 06/06/21 ?1015  ?INR 1.0  ? ? ?Cardiac Enzymes: ?No results for input(s): CKTOTAL, CKMB, CKMBINDEX, TROPONINI in the last 168 hours. ?BNP (last 3 results) ?No results for input(s): PROBNP in the last 8760 hours. ?HbA1C: ?No results for input(s): HGBA1C in the last 72 hours. ?CBG: ?No results for input(s): GLUCAP in the last 168 hours. ?Lipid Profile: ?No results for input(s): CHOL, HDL, LDLCALC, TRIG, CHOLHDL, LDLDIRECT in the last 72 hours. ?Thyroid Function Tests: ?No results for input(s): TSH, T4TOTAL, FREET4, T3FREE, THYROIDAB in the last 72 hours. ?Anemia Panel: ?No results for input(s): VITAMINB12, FOLATE, FERRITIN, TIBC, IRON, RETICCTPCT in the last 72 hours. ?Sepsis Labs: ?Recent Labs  ?Lab 06/06/21 ?2100 06/07/21 ?0344 06/07/21 ?1024 06/07/21 ?1610  ?LATICACIDVEN 1.1 1.0 0.9 0.9  ? ? ? ?Recent Results (from the past 240 hour(s))  ?Surgical pcr screen     Status: None  ? Collection Time: 06/06/21  9:39 AM  ? Specimen: Nasal Mucosa; Nasal Swab  ?Result Value Ref Range Status  ? MRSA, PCR NEGATIVE NEGATIVE Final  ? Staphylococcus aureus NEGATIVE NEGATIVE Final  ?  Comment: (NOTE) ?The Xpert SA Assay (FDA approved for NASAL specimens in patients 64 ?years of age and  older), is one component of a comprehensive ?surveillance program. It is not intended to diagnose infection nor to ?guide or monitor treatment. ?Performed at [facility], [address], [place], [address] ?[facility] ?  ? ?  ? ? ? ? ? ?Radiology Studies: ?ECHOCARDIOGRAM COMPLETE ? ?Result Date: 06/07/2021 ?   ECHOCARDIOGRAM REPORT   Patient Name:   Brian Cannon Date of Exam: 06/07/2021 Medical Rec #:  [id number]     Height:  74.0 in Accession #:    [id number]    Weight:       168.9 lb Date of Birth:  1960-08-23     BSA:          2.023 m? Patient Age:    60 years      BP:           106/49 mmHg Patient Gender: M             HR:           69 bpm. Exam Location:  Inpatient Procedure: 2D Echo, Cardiac Doppler and Color Doppler Indications:     Dyspnea  History:         Patient has no prior history of Echocardiogram examinations.  Sonographer:     Eulah Pont RDCS Referring Phys:  [id number] Keto ORTIZ Diagnosing Phys: Epifanio Lesches MD IMPRESSIONS  1. Left ventricular ejection fraction, by estimation, is 50 to 55%. The left ventricle has low normal function. The left ventricle has no regional wall motion abnormalities. There is mild left ventricular hypertrophy. Left ventricular diastolic parameters were normal.  2. Right ventricular systolic function is normal. The right ventricular size is mildly enlarged. There is normal pulmonary artery systolic pressure.  3. The mitral valve is normal in structure. No evidence of mitral valve regurgitation. No evidence of mitral stenosis.  4. The aortic valve is tricuspid. Aortic valve regurgitation is not visualized. No aortic stenosis is present.  5. The inferior vena cava is normal in size with greater than 50% respiratory variability, suggesting right atrial pressure of 3 mmHg. FINDINGS  Left Ventricle: Left ventricular ejection fraction, by estimation, is 50 to 55%. The left ventricle has low normal function. The left ventricle has no regional wall motion  abnormalities. The left ventricular internal cavity size was normal in size. There is mild left ventricular hypertrophy. Left ventricular diastolic parameters were normal. Right Ventricle: The right ven

## 2021-06-08 NOTE — Progress Notes (Signed)
Paged Dr. Dow Adolph at approximately 0000 concerning patient's elevated HR. Dr. Margo Aye ordered Esmolol, nitroglycerin, dilaudid, and IV metoprolol over the next few hours. Patient achieved HR goal at 0530 after interventions described above.  ?

## 2021-06-09 ENCOUNTER — Encounter (HOSPITAL_COMMUNITY): Payer: Self-pay | Admitting: Internal Medicine

## 2021-06-09 ENCOUNTER — Other Ambulatory Visit: Payer: Self-pay

## 2021-06-09 LAB — BASIC METABOLIC PANEL
Anion gap: 10 (ref 5–15)
BUN: 6 mg/dL — ABNORMAL LOW (ref 8–23)
CO2: 21 mmol/L — ABNORMAL LOW (ref 22–32)
Calcium: 8.4 mg/dL — ABNORMAL LOW (ref 8.9–10.3)
Chloride: 105 mmol/L (ref 98–111)
Creatinine, Ser: 0.93 mg/dL (ref 0.61–1.24)
GFR, Estimated: >60 mL/min (ref >60)
Glucose, Bld: 83 mg/dL (ref 70–99)
Potassium: 3.8 mmol/L (ref 3.5–5.1)
Sodium: 136 mmol/L (ref 135–145)

## 2021-06-09 LAB — CBC
HCT: 34.8 % — ABNORMAL LOW (ref 39.0–52.0)
Hemoglobin: 12.1 g/dL — ABNORMAL LOW (ref 13.0–17.0)
MCH: 33.4 pg (ref 26.0–34.0)
MCHC: 34.8 g/dL (ref 30.0–36.0)
MCV: 96.1 fL (ref 80.0–100.0)
Platelets: 168 10*3/uL (ref 150–400)
RBC: 3.62 MIL/uL — ABNORMAL LOW (ref 4.22–5.81)
RDW: 13.2 % (ref 11.5–15.5)
WBC: 7.1 10*3/uL (ref 4.0–10.5)
nRBC: 0 % (ref 0.0–0.2)

## 2021-06-09 MED ORDER — LISINOPRIL 20 MG PO TABS
40.0000 mg | ORAL_TABLET | Freq: Every day | ORAL | Status: DC
  Administered 2021-06-09 – 2021-06-11 (×3): 40 mg via ORAL
  Filled 2021-06-09 (×3): qty 2

## 2021-06-09 MED ORDER — HYDROCHLOROTHIAZIDE 12.5 MG PO TABS
12.5000 mg | ORAL_TABLET | Freq: Two times a day (BID) | ORAL | Status: DC
  Administered 2021-06-09 (×2): 12.5 mg via ORAL
  Filled 2021-06-09 (×3): qty 1

## 2021-06-09 MED ORDER — CARVEDILOL 25 MG PO TABS
25.0000 mg | ORAL_TABLET | Freq: Two times a day (BID) | ORAL | Status: DC
  Administered 2021-06-09 – 2021-06-11 (×5): 25 mg via ORAL
  Filled 2021-06-09 (×5): qty 1

## 2021-06-09 MED ORDER — HYDROMORPHONE HCL 1 MG/ML IJ SOLN
0.5000 mg | INTRAMUSCULAR | Status: DC | PRN
  Administered 2021-06-09: 0.5 mg via INTRAVENOUS
  Filled 2021-06-09: qty 0.5

## 2021-06-09 MED ORDER — HYDROCODONE-ACETAMINOPHEN 5-325 MG PO TABS
1.0000 | ORAL_TABLET | ORAL | Status: DC | PRN
  Administered 2021-06-09 – 2021-06-11 (×3): 1 via ORAL
  Filled 2021-06-09 (×3): qty 1

## 2021-06-09 NOTE — Progress Notes (Addendum)
?  Progress Note ? ? ? ?06/09/2021 ?6:49 AM ?Hospital Day 3 ? ?Subjective:  denies chest or back pain.  Did not get much sleep last night.  Appreciates medical team.  He did walk this morning and has been out of bed.   ? ?Tm 99.4 now afebrile ?HR 70's-80's ?90's-130's systolic ?98% RA ? ?Gtts:  esmolol ? ?Vitals:  ? 06/09/21 0500 06/09/21 0600  ?BP: 135/72 130/76  ?Pulse: 68 76  ?Resp: 14 15  ?Temp:    ?SpO2: 94% 96%  ? ? ?Physical Exam: ?Cardiac:  regular ?Lungs:  non labored ?Abdomen:  soft, NT/ND ?Extremities:  BLE warm and well perfused. ? ?CBC ?   ?Component Value Date/Time  ? WBC 7.1 06/09/2021 0202  ? RBC 3.62 (L) 06/09/2021 0202  ? HGB 12.1 (L) 06/09/2021 0202  ? HCT 34.8 (L) 06/09/2021 0202  ? PLT 168 06/09/2021 0202  ? MCV 96.1 06/09/2021 0202  ? MCH 33.4 06/09/2021 0202  ? MCHC 34.8 06/09/2021 0202  ? RDW 13.2 06/09/2021 0202  ? LYMPHSABS 2.6 10/14/2013 1719  ? MONOABS 0.6 10/14/2013 1719  ? EOSABS 0.0 10/14/2013 1719  ? BASOSABS 0.0 10/14/2013 1719  ? ? ?BMET ?   ?Component Value Date/Time  ? NA 136 06/09/2021 0202  ? K 3.8 06/09/2021 0202  ? CL 105 06/09/2021 0202  ? CO2 21 (L) 06/09/2021 0202  ? GLUCOSE 83 06/09/2021 0202  ? BUN 6 (L) 06/09/2021 0202  ? CREATININE 0.93 06/09/2021 0202  ? CALCIUM 8.4 (L) 06/09/2021 0202  ? GFRNONAA >60 06/09/2021 0202  ? GFRAA >60 06/20/2017 1502  ? ? ?INR ?   ?Component Value Date/Time  ? INR 1.0 06/06/2021 1015  ? ? ? ?Intake/Output Summary (Last 24 hours) at 06/09/2021 0649 ?Last data filed at 06/09/2021 0600 ?Gross per 24 hour  ?Intake 1481.41 ml  ?Output 1450 ml  ?Net 31.41 ml  ? ? ? ?Assessment/Plan:  61 y.o. male with acute, uncomplicated type B aortic dissection  Hospital Day 3 ? ?-pt continues to be without chest or back pain.   ?-CCB and BB started po-esmolol continues but has been weaned down.  He had pulled his IV out last night and esmolol restarted at higher rate due to that but had been down to 25mcg/kg/min.  Continue to wean per primary team. ?-hgb  stable ?-renal function stable after CTA ?-continue to mobilize ? ?Doreatha Massed, PA-C ?[facility] ?[phone] ?06/09/2021 ?6:49 AM ? ?VASCULAR STAFF ADDENDUM: ?I have independently interviewed and examined the patient. ?I agree with the above.  ?Pt doing well. Plan for home once oral antihypertensive regimen optimized.  ?Appreciate care provided by the ICU. ? ?J. Gillis Santa, MD ?[facility] ?Office Phone Number: [phone] ?06/09/2021 10:30 AM ? ? ? ?

## 2021-06-09 NOTE — Plan of Care (Signed)
?  Problem: Education: ?Goal: Knowledge of General Education information will improve ?Description: Including pain rating scale, medication(s)/side effects and non-pharmacologic comfort measures ?Outcome: Progressing ?  ?Problem: Clinical Measurements: ?Goal: Diagnostic test results will improve ?Outcome: Progressing ?Goal: Respiratory complications will improve ?Outcome: Progressing ?Goal: Cardiovascular complication will be avoided ?Outcome: Progressing ?  ?Problem: Activity: ?Goal: Risk for activity intolerance will decrease ?Outcome: Progressing ?  ?Problem: Elimination: ?Goal: Will not experience complications related to urinary retention ?Outcome: Progressing ?  ?Problem: Pain Managment: ?Goal: General experience of comfort will improve ?Outcome: Progressing ?  ?

## 2021-06-09 NOTE — Progress Notes (Signed)
?PROGRESS NOTE ? ? ? ?Brian Cannon  [id number] DOB: [birth year] DOA: 06/06/2021 ?PCP: Pcp, No ? ? ?Brief Narrative:  ?Brian Cannon is a 61 y.o. male past medical history of tobacco abuse for 40 years having abdominal pain 5 days prior to admission he relates pain which has progressively gotten worse is just constant radiates to his back a comfortable position he denies any fever cough or diarrhea.  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  Vascular consulted at intake, hospitalist called for admission. ? ?Assessment & Plan: ?  ?Principal Problem: ?  Aortic dissection, abdominal ([place]) ?Active Problems: ?  Abdominal aortic aneurysm dissection ([place]) ?  Essential hypertension ?  Hyponatremia ?  Hypokalemia ?  Descending thoracic dissection ?  Thoracic aortic dissection ? ?Acute symptomatic uncomplicated thoracic and abdominal aortic aneurysm dissection, POA ?-Vascular surgery following, appreciate insight and recommendations:SBP<120, HR <80 ?-Repeat CTA 06/08/2021 "no significant interval change" per vascular ?-Continue to titrate patient's p.o. medications, currently on max dose carvedilol amlodipine and lisinopril ?-Add HCTZ twice daily today, titrate as appropriate.  May need to add nitrate versus clonidine if unable to control patient's hypertension. ?-Continue to wean esmolol down, IV accidentally removed overnight, able to restart esmolol earlier this morning and weaned aggressively this afternoon.  Hopefully patient will be off esmolol by the end of the day with new additional medications. ?-Continue pain management with hydrocodone, Dilaudid -we will continue to wean aggressively on these medications ? ?Essential hypertension ?Continue to uptitrate carvedilol/etc. as  above ?  ?Hypokalemia ?Replete as necessary, currently within normal limits, mag within normal limits  ?  ?DVT Prophylaxis: lovenox ?Code Status: Full  ?Family Communication: none  ? ?Status is: Inpatient ? ?Dispo: The patient is from: Home ?             Anticipated d/c is to: To be determined ?             Anticipated d/c date is: 24-48 hours ?             Patient currently not medically stable for discharge ? ?Consultants:  ?Vascular surgery ? ?Procedures:  ?None ? ?Antimicrobials:  ?None ? ?Subjective: ?IV dislodged overnight, patient's esmolol was unfortunately unable to be given - new IV started and able to be caught up.  Otherwise chest pain is markedly improving denies nausea vomiting diarrhea constipation shortness of breath headache or fevers. ? ?Objective: ?Vitals:  ? 06/09/21 0400 06/09/21 0500 06/09/21 0600 06/09/21 0700  ?BP: 126/67 135/72 130/76 131/69  ?Pulse: 88 68 76 87  ?Resp: (!) 21 14 15    ?Temp: 98.1 ?F (36.7 ?C)     ?TempSrc: Oral     ?SpO2: 92% 94% 96%   ?Weight:      ?Height:      ? ? ?Intake/Output Summary (Last 24 hours) at 06/09/2021 0728 ?Last data filed at 06/09/2021 0700 ?Gross per 24 hour  ?Intake 1458.52 ml  ?Output 1450 ml  ?Net 8.52 ml  ? ? ?Filed Weights  ? 06/06/21 0930 06/07/21 0500 06/08/21 0500  ?Weight: 77.1 kg 76.6 kg 77.1 kg  ? ? ?Examination: ? ?General exam: Appears calm and comfortable  ?Respiratory system: Clear to auscultation. Respiratory effort normal. ?Cardiovascular system: S1 & S2 heard, RRR. No JVD, murmurs, rubs,  gallops or clicks. No pedal edema. ?Gastrointestinal system: Abdomen is nondistended, soft and nontender. No organomegaly or masses felt. Normal bowel sounds heard. ?Central nervous system: Alert and oriented. No focal neurological deficits. ?Extremities: Symmetric 5 x 5 power. ?Skin: No rashes, lesions or ulcers ?Psychiatry: Judgement and insight appear normal. Mood & affect appropriate.  ? ?Data Reviewed: I have personally reviewed following labs and  imaging studies ? ?CBC: ?Recent Labs  ?Lab 06/07/21 ?0205 06/07/21 ?1024 06/07/21 ?1610 06/08/21 ?0205 06/09/21 ?0202  ?WBC 7.2 6.9 6.5 7.5 7.1  ?HGB 12.8* 12.6* 12.3* 12.3* 12.1*  ?HCT 37.4* 36.3* 35.8* 34.8* 34.8*  ?MCV 97.9 97.1 97.0 96.1 96.1  ?PLT 173 168 166 171 168  ? ? ?Basic Metabolic Panel: ?Recent Labs  ?Lab 06/06/21 ?[id number] 06/06/21 ?1015 06/07/21 ?0159 06/07/21 ?1024 06/07/21 ?1610 06/08/21 ?0205 06/09/21 ?0202  ?NA  --    < > 137 136 135 137 136  ?K  --    < > 3.7 3.6 3.5 3.5 3.8  ?CL  --    < > 107 106 110 109 105  ?CO2  --    < > 23 23 22 23  21*  ?GLUCOSE  --    < > 93 105* 79 86 83  ?BUN  --    < > 10 8 6* 5* 6*  ?CREATININE  --    < > 0.89 0.93 0.90 0.92 0.93  ?CALCIUM  --    < > 8.3* 8.2* 8.2* 8.4* 8.4*  ?MG 1.7  --   --   --   --   --   --   ? < > = values in this interval not displayed.  ? ? ?GFR: ?Estimated Creatinine Clearance: 91 mL/min (by C-G formula based on SCr of 0.93 mg/dL). ?Liver Function Tests: ?Recent Labs  ?Lab 06/06/21 ?0253 06/06/21 ?1523 06/07/21 ?1024 06/07/21 ?1610  ?AST 25 17 15 15   ?ALT 17 16 13 13   ?ALKPHOS 56 52 49 48  ?BILITOT 0.6 0.9 0.8 0.7  ?PROT 7.4 6.1* 5.7* 5.9*  ?ALBUMIN 4.1 3.3* 3.0* 3.0*  ? ? ?Recent Labs  ?Lab 06/06/21 ?0253  ?LIPASE 30  ? ? ?No results for input(s): AMMONIA in the last 168 hours. ?Coagulation Profile: ?Recent Labs  ?Lab 06/06/21 ?1015  ?INR 1.0  ? ? ?Cardiac Enzymes: ?No results for input(s): CKTOTAL, CKMB, CKMBINDEX, TROPONINI in the last 168 hours. ?BNP (last 3 results) ?No results for input(s): PROBNP in the last 8760 hours. ?HbA1C: ?No results for input(s): HGBA1C in the last 72 hours. ?CBG: ?No results for input(s): GLUCAP in the last 168 hours. ?Lipid Profile: ?No results for input(s): CHOL, HDL, LDLCALC, TRIG, CHOLHDL, LDLDIRECT in the last 72 hours. ?Thyroid Function Tests: ?No results for input(s): TSH, T4TOTAL, FREET4, T3FREE, THYROIDAB in the last 72 hours. ?Anemia Panel: ?No results for input(s): VITAMINB12, FOLATE, FERRITIN, TIBC,  IRON, RETICCTPCT in the last 72 hours. ?Sepsis Labs: ?Recent Labs  ?Lab 06/06/21 ?2100 06/07/21 ?0344 06/07/21 ?1024 06/07/21 ?1610  ?LATICACIDVEN 1.1 1.0 0.9 0.9  ? ? ? ?Recent Results (from the past 240 hour(s))  ?Surgical pcr screen     Status: None  ? Collection Time: 06/06/21  9:39 AM  ? Specimen: Nasal Mucosa; Nasal Swab  ?Result Value Ref Range Status  ? MRSA, PCR NEGATIVE NEGATIVE Final  ? Staphylococcus aureus NEGATIVE NEGATIVE Final  ?  Comment: (NOTE) ?The Xpert SA Assay (FDA approved for NASAL specimens in patients 56 ?years of age and older), is one component  of a comprehensive ?surveillance program. It is not intended to diagnose infection nor to ?guide or monitor treatment. ?Performed at [facility], [place] [address], [place], [place] ?[address] ?  ? ?  ? ? ? ? ? ?Radiology Studies: ?ECHOCARDIOGRAM COMPLETE ? ?Result Date: 06/07/2021 ?   ECHOCARDIOGRAM REPORT   Patient Name:   SHEDDRICK ARRONA Date of Exam: 06/07/2021 Medical Rec #:  [id number]     Height:       74.0 in Accession #:    [id number]    Weight:       168.9 lb Date of Birth:  [birth year]     BSA:          2.023 m? Patient Age:    31 years      BP:           106/49 mmHg Patient Gender: M             HR:           69 bpm. Exam Location:  Inpatient Procedure: 2D Echo, Cardiac Doppler and Color Doppler Indications:     Dyspnea  History:         Patient has no prior history of Echocardiogram examinations.  Sonographer:     Bernadene Person RDCS Referring Phys:  [place] Diagnosing Phys: Oswaldo Milian MD IMPRESSIONS  1. Left ventricular ejection fraction, by estimation, is 50 to 55%. The left ventricle has low normal function. The left ventricle has no regional wall motion abnormalities. There is mild left ventricular hypertrophy. Left ventricular diastolic parameters were normal.  2. Right ventricular systolic function is normal. The right ventricular size is mildly enlarged. There is normal pulmonary artery systolic pressure.   3. The mitral valve is normal in structure. No evidence of mitral valve regurgitation. No evidence of mitral stenosis.  4. The aortic valve is tricuspid. Aortic valve regurgitation is not visualized. No ao

## 2021-06-10 ENCOUNTER — Inpatient Hospital Stay (HOSPITAL_COMMUNITY): Payer: Self-pay

## 2021-06-10 DIAGNOSIS — K5900 Constipation, unspecified: Secondary | ICD-10-CM | POA: Clinically undetermined

## 2021-06-10 DIAGNOSIS — R109 Unspecified abdominal pain: Secondary | ICD-10-CM | POA: Clinically undetermined

## 2021-06-10 LAB — BASIC METABOLIC PANEL
Anion gap: 10 (ref 5–15)
BUN: 7 mg/dL — ABNORMAL LOW (ref 8–23)
CO2: 26 mmol/L (ref 22–32)
Calcium: 8.8 mg/dL — ABNORMAL LOW (ref 8.9–10.3)
Chloride: 98 mmol/L (ref 98–111)
Creatinine, Ser: 1 mg/dL (ref 0.61–1.24)
GFR, Estimated: >60 mL/min (ref >60)
Glucose, Bld: 100 mg/dL — ABNORMAL HIGH (ref 70–99)
Potassium: 3.4 mmol/L — ABNORMAL LOW (ref 3.5–5.1)
Sodium: 134 mmol/L — ABNORMAL LOW (ref 135–145)

## 2021-06-10 LAB — CBC
HCT: 36 % — ABNORMAL LOW (ref 39.0–52.0)
Hemoglobin: 13 g/dL (ref 13.0–17.0)
MCH: 34 pg (ref 26.0–34.0)
MCHC: 36.1 g/dL — ABNORMAL HIGH (ref 30.0–36.0)
MCV: 94.2 fL (ref 80.0–100.0)
Platelets: 213 10*3/uL (ref 150–400)
RBC: 3.82 MIL/uL — ABNORMAL LOW (ref 4.22–5.81)
RDW: 12.9 % (ref 11.5–15.5)
WBC: 6.5 10*3/uL (ref 4.0–10.5)
nRBC: 0 % (ref 0.0–0.2)

## 2021-06-10 LAB — MAGNESIUM: Magnesium: 1.6 mg/dL — ABNORMAL LOW (ref 1.7–2.4)

## 2021-06-10 MED ORDER — MAGNESIUM SULFATE 4 GM/100ML IV SOLN
4.0000 g | Freq: Once | INTRAVENOUS | Status: AC
  Administered 2021-06-10: 4 g via INTRAVENOUS
  Filled 2021-06-10: qty 100

## 2021-06-10 MED ORDER — POTASSIUM CHLORIDE CRYS ER 10 MEQ PO TBCR
40.0000 meq | EXTENDED_RELEASE_TABLET | Freq: Once | ORAL | Status: AC
  Administered 2021-06-10: 40 meq via ORAL
  Filled 2021-06-10: qty 4

## 2021-06-10 MED ORDER — SORBITOL 70 % SOLN
30.0000 mL | Status: AC
  Administered 2021-06-10: 30 mL via ORAL
  Filled 2021-06-10: qty 30

## 2021-06-10 MED ORDER — ALUM & MAG HYDROXIDE-SIMETH 200-200-20 MG/5ML PO SUSP
30.0000 mL | ORAL | Status: DC | PRN
  Administered 2021-06-10 (×2): 30 mL via ORAL
  Filled 2021-06-10 (×2): qty 30

## 2021-06-10 MED ORDER — HYDROCHLOROTHIAZIDE 12.5 MG PO TABS
12.5000 mg | ORAL_TABLET | Freq: Two times a day (BID) | ORAL | Status: DC
  Administered 2021-06-10 (×2): 12.5 mg via ORAL
  Filled 2021-06-10 (×2): qty 1

## 2021-06-10 MED ORDER — SIMETHICONE 80 MG PO CHEW
160.0000 mg | CHEWABLE_TABLET | Freq: Four times a day (QID) | ORAL | Status: DC
  Administered 2021-06-10 – 2021-06-11 (×4): 160 mg via ORAL
  Filled 2021-06-10 (×5): qty 2

## 2021-06-10 MED ORDER — HYDROCHLOROTHIAZIDE 12.5 MG PO TABS
12.5000 mg | ORAL_TABLET | Freq: Two times a day (BID) | ORAL | Status: DC
Start: 2021-06-10 — End: 2021-06-10

## 2021-06-10 MED ORDER — POTASSIUM CHLORIDE CRYS ER 20 MEQ PO TBCR: 40.0000 meq | EXTENDED_RELEASE_TABLET | Freq: Once | ORAL | Status: DC

## 2021-06-10 NOTE — Progress Notes (Signed)
?PROGRESS NOTE ? ? ? ?Brian Cannon  SVX:[id number] DOB: [birth year] DOA: 06/06/2021 ?PCP: Pcp, No  ? ? ?Chief Complaint  ?Patient presents with  ? Abdominal Pain  ? ? ?Brief Narrative:  ?Brian Cannon is a 62 y.o. male past medical history of tobacco abuse for 40 years having abdominal pain 5 days prior to admission he relates pain which has progressively gotten worse is just constant radiates to his back a comfortable position he denies any fever cough or diarrhea.  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  Vascular consulted at intake, hospitalist called for admission. ? ? ?Assessment & Plan: ?  ?Principal Problem: ?  Aortic dissection, abdominal ([facility]) ?Active Problems: ?  Abdominal aortic aneurysm dissection ([facility]) ?  Essential hypertension ?  Hyponatremia ?  Hypokalemia ?  Descending thoracic dissection ?  Thoracic aortic dissection ?  Hypomagnesemia ?  Abdominal cramps ?  Constipation ? ? ?#1 acute symptomatic uncomplicated thoracic and abdominal aortic aneurysm dissection, POA ?-Patient noted to have presented with progressive abdominal pain with radiation to the back, uncomfortable position.  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 ?-Patient seen in consultation by vascular surgery who recommended medical management at this time with goal SBP , 120, HR < 80. ?-Repeat CTA 06/08/2021 " with no significant interval change" per vascular surgery. ?-Blood pressure improved on current regimen of     Coreg, Norvasc, HCTZ, lisinopril. ?-Esmolol drip weaned off on 06/09/2021.      ?-Continue current pain  regimen.         ?-Vascular surgery following and recommending outpatient follow-up with Dr. Sherral Hammers in 4 weeks with repeat CT angiogram chest abdomen and pelvis. ?-Transfer to progressive care unit. ?-Per vascular surgery. ? ?2.  Abdominal cramps/?  Constipation ?-Patient with complaints of abdominal cramping this morning. ?-Patient noted not to have had a bowel movement in 5 days. ?-Patient with some associated burping. ?-Patient given MiraLAX this morning. ?-Change MiraLAX to twice daily. ?-Add Senokot-S twice daily. ?-Sorbitol p.o. every 3 hours x2 doses. ?-Check abdominal films. ?-Supportive care. ? ?3.  Hypertension ?-Continue current regimen of Coreg, Norvasc, HCTZ, lisinopril. ?-Hopefully can consolidate HCTZ to 25 mg daily tomorrow. ? ?4.  Hypokalemia/hypomagnesemia ?-Magnesium sulfate 4 g IV x1. ?-K-Dur 40 mEq p.o. x1 ? ? ? ?DVT prophylaxis: Lovenox ?Code Status: Full ?Family Communication: Updated patient.  No family at bedside. ?Disposition:  ? ?Status is: Inpatient ?Remains inpatient appropriate because: Severity of illness ?  ?Consultants:  ?Vascular surgery: Dr. Sherral Hammers 06/06/2021 ? ?Procedures:  ?CT angiogram chest abdomen and pelvis 06/06/2021, 06/08/2021 ?CT abdomen and pelvis 06/06/2021 ?2D echo 06/07/2021 ? ?Antimicrobials:  ?None ? ? ?Subjective: ?Patient sitting on toilet.  States abdominal cramping.  Has not had a bowel movement in 5 days.  Diffuse left upper chest area feels like may have a bubble.  Has been burping.  States chest pain he presented with on admission improving.  No significant worsening shortness of breath.  Currently off esmolol drip. ? ?Objective: ?Vitals:  ? 06/10/21 0400 06/10/21 0500 06/10/21 0700 06/10/21 0745  ?BP: (!) 125/50 131/69 115/70 128/73  ?Pulse: 68  84 78 69  ?Resp: 12 15 14    ?Temp: 98.1 ?F (36.7 ?C)   98 ?F (36.7 ?C)  ?TempSrc: Oral   Oral  ?SpO2: 100% 95% 95% 98%  ?Weight:      ?Height:      ? ? ?Intake/Output Summary (Last 24 hours) at 06/10/2021 1051 ?Last data  filed at 06/10/2021 0800 ?Gross per 24 hour  ?Intake 1025.09 ml  ?Output 1725 ml  ?Net -699.91 ml  ? ?Filed Weights  ? 06/06/21 0930 06/07/21 0500 06/08/21 0500  ?Weight: 77.1 kg 76.6 kg 77.1 kg  ? ? ?Examination: ? ?General exam: Appears calm and comfortable  ?Respiratory system: Clear to auscultation. Respiratory effort normal. ?Cardiovascular system: S1 & S2 heard, RRR. No JVD, murmurs, rubs, gallops or clicks. No pedal edema. ?Gastrointestinal system: Abdomen is nondistended, soft and nontender. No organomegaly or masses felt. Normal bowel sounds heard. ?Central nervous system: Alert and oriented. No focal neurological deficits. ?Extremities: Symmetric 5 x 5 power. ?Skin: No rashes, lesions or ulcers ?Psychiatry: Judgement and insight appear normal. Mood & affect appropriate.  ? ? ? ?Data Reviewed: I have personally reviewed following labs and imaging studies ? ?CBC: ?Recent Labs  ?Lab 06/07/21 ?1024 06/07/21 ?1610 06/08/21 ?0205 06/09/21 ?0202 06/10/21 ?0201  ?WBC 6.9 6.5 7.5 7.1 6.5  ?HGB 12.6* 12.3* 12.3* 12.1* 13.0  ?HCT 36.3* 35.8* 34.8* 34.8* 36.0*  ?MCV 97.1 97.0 96.1 96.1 94.2  ?PLT 168 166 171 168 213  ? ? ?Basic Metabolic Panel: ?Recent Labs  ?Lab 06/06/21 ?06/08/21 06/06/21 ?1015 06/07/21 ?1024 06/07/21 ?1610 06/08/21 ?0205 06/09/21 ?0202 06/10/21 ?0201  ?NA  --    < > 136 135 137 136 134*  ?K  --    < > 3.6 3.5 3.5 3.8 3.4*  ?CL  --    < > 106 110 109 105 98  ?CO2  --    < > 23 22 23  21* 26  ?GLUCOSE  --    < > 105* 79 86 83 100*  ?BUN  --    < > 8 6* 5* 6* 7*  ?CREATININE  --    < > 0.93 0.90 0.92 0.93 1.00  ?CALCIUM  --    < > 8.2* 8.2* 8.4* 8.4* 8.8*  ?MG 1.7  --   --   --   --   --  1.6*  ? < > = values in this interval not displayed.  ? ? ?GFR: ?Estimated Creatinine Clearance: 84.6 mL/min (by C-G formula based on SCr of 1 mg/dL). ? ?Liver Function Tests: ?Recent Labs  ?Lab 06/06/21 ?0253 06/06/21 ?1523 06/07/21 ?1024 06/07/21 ?1610  ?AST 25 17 15 15   ?ALT 17 16 13 13   ?ALKPHOS 56 52 49 48  ?BILITOT  0.6 0.9 0.8 0.7  ?PROT 7.4 6.1* 5.7* 5.9*  ?ALBUMIN 4.1 3.3* 3.0* 3.0*  ? ? ?CBG: ?No results for input(s): GLUCAP in the last 168 hours. ? ? ?Recent Results (from the past 240 hour(s))  ?Surgical pcr screen     Status: None  ? Collection Time: 06/06/21  9:39 AM  ? Specimen: Nasal Mucosa; Nasal Swab  ?Result Value Ref Range Status  ? MRSA, PCR NEGATIVE NEGATIVE Final  ? Staphylococcus aureus NEGATIVE NEGATIVE Final  ?  Comment: (NOTE) ?The Xpert SA Assay (FDA approved for NASAL specimens in patients 60 ?years of age and older), is one component of a comprehensive ?surveillance program. It is not intended to diagnose infection nor to ?guide or monitor treatment. ?Performed  at [facility], [address] [place], [place] ?[id number] ?  ?  ? ? ? ? ? ?Radiology Studies: ?CT Angio Chest/Abd/Pel for Dissection W and/or W/WO ? ?Result Date: 06/08/2021 ?CLINICAL DATA:  Thoracic aortic dissection EXAM: CT ANGIOGRAPHY CHEST, ABDOMEN AND PELVIS TECHNIQUE: Non-contrast CT of the chest was initially obtained. Multidetector CT imaging through the chest, abdomen and pelvis was performed using the standard protocol during bolus administration of intravenous contrast. Multiplanar reconstructed images and MIPs were obtained and reviewed to evaluate the vascular anatomy. RADIATION DOSE REDUCTION: This exam was performed according to the departmental dose-optimization program which includes automated exposure control, adjustment of the mA and/or kV according to patient size and/or use of iterative reconstruction technique. CONTRAST:  100mL OMNIPAQUE IOHEXOL 350 MG/ML SOLN COMPARISON:  06/06/2021 FINDINGS: CTA CHEST FINDINGS Cardiovascular: Stable aortic dissection beginning just beyond the takeoff of the left subclavian artery, and extending through the level of the aortic bifurcation. The aortic arch just proximal to the dissection flap measures 2.9 cm, with the distal aortic arch measuring up to 3.6 cm, stable. No evidence of  intramural hematoma or rupture. The heart is unremarkable without pericardial effusion. Stable coronary artery atherosclerosis greatest in the LAD distribution. While not optimized for opacification of

## 2021-06-10 NOTE — Progress Notes (Addendum)
?  Progress Note ? ? ? ?06/10/2021 ?6:48 AM ?Hospital Day 4 ? ?Subjective:  says he needs to have  a BM as it has been a few days.  He has gotten some miralax.  Otherwise, he denies chest or back pain.   ? ?Afebrile ?HR 60's-80's ?100's-130's systolic ?94% RA ? ?Gtts: none ? ?Vitals:  ? 06/10/21 0400 06/10/21 0500  ?BP: (!) 125/50 131/69  ?Pulse: 68 84  ?Resp: 12 15  ?Temp: 98.1 ?F (36.7 ?C)   ?SpO2: 100% 95%  ? ? ?Physical Exam: ?Cardiac:  regular ?Lungs:  non labored ?Extremities:  bilateral feet are warm and well perfused.  ? ?CBC ?   ?Component Value Date/Time  ? WBC 6.5 06/10/2021 0201  ? RBC 3.82 (L) 06/10/2021 0201  ? HGB 13.0 06/10/2021 0201  ? HCT 36.0 (L) 06/10/2021 0201  ? PLT 213 06/10/2021 0201  ? MCV 94.2 06/10/2021 0201  ? MCH 34.0 06/10/2021 0201  ? MCHC 36.1 (H) 06/10/2021 0201  ? RDW 12.9 06/10/2021 0201  ? LYMPHSABS 2.6 10/14/2013 1719  ? MONOABS 0.6 10/14/2013 1719  ? EOSABS 0.0 10/14/2013 1719  ? BASOSABS 0.0 10/14/2013 1719  ? ? ?BMET ?   ?Component Value Date/Time  ? NA 134 (L) 06/10/2021 0201  ? K 3.4 (L) 06/10/2021 0201  ? CL 98 06/10/2021 0201  ? CO2 26 06/10/2021 0201  ? GLUCOSE 100 (H) 06/10/2021 0201  ? BUN 7 (L) 06/10/2021 0201  ? CREATININE 1.00 06/10/2021 0201  ? CALCIUM 8.8 (L) 06/10/2021 0201  ? GFRNONAA >60 06/10/2021 0201  ? GFRAA >60 06/20/2017 1502  ? ? ?INR ?   ?Component Value Date/Time  ? INR 1.0 06/06/2021 1015  ? ? ? ?Intake/Output Summary (Last 24 hours) at 06/10/2021 0648 ?Last data filed at 06/10/2021 0500 ?Gross per 24 hour  ?Intake 979.06 ml  ?Output 1725 ml  ?Net -745.94 ml  ? ? ? ?Assessment/Plan:  61 y.o. male with acute, uncomplicated type B aortic dissection   Hospital Day 4 ? ?-pt continues to be without back or chest pain.  Continue with strict BP control ?-esmolol gtt weaned off.  Pt now on amlodipine, coreg, HCTZ and lisinopril.  BP improved with systolic 100's-130's.   ?-hgb stable and improving ?-follow up with Dr. Karin Lieu in 4 weeks with CTA c/a/p ? ?Doreatha Massed, PA-C ?[facility] ?[phone] ?06/10/2021 ?6:48 AM ? ?VASCULAR STAFF ADDENDUM: ?I have independently interviewed and examined the patient. ?I agree with the above.  ?Pt seen after xray. Feels improved. Since BM.  ?Denies chest, back, abd pain  ? ?J. Gillis Santa, MD ?[facility] ?Office Phone Number: [phone] ?06/10/2021 12:17 PM ? ? ? ?

## 2021-06-11 ENCOUNTER — Other Ambulatory Visit (HOSPITAL_COMMUNITY): Payer: Self-pay

## 2021-06-11 LAB — CBC
HCT: 36.7 % — ABNORMAL LOW (ref 39.0–52.0)
Hemoglobin: 12.8 g/dL — ABNORMAL LOW (ref 13.0–17.0)
MCH: 33.2 pg (ref 26.0–34.0)
MCHC: 34.9 g/dL (ref 30.0–36.0)
MCV: 95.1 fL (ref 80.0–100.0)
Platelets: 258 10*3/uL (ref 150–400)
RBC: 3.86 MIL/uL — ABNORMAL LOW (ref 4.22–5.81)
RDW: 12.8 % (ref 11.5–15.5)
WBC: 6.7 10*3/uL (ref 4.0–10.5)
nRBC: 0 % (ref 0.0–0.2)

## 2021-06-11 LAB — BASIC METABOLIC PANEL
Anion gap: 10 (ref 5–15)
BUN: 9 mg/dL (ref 8–23)
CO2: 27 mmol/L (ref 22–32)
Calcium: 9 mg/dL (ref 8.9–10.3)
Chloride: 98 mmol/L (ref 98–111)
Creatinine, Ser: 1.04 mg/dL (ref 0.61–1.24)
GFR, Estimated: >60 mL/min (ref >60)
Glucose, Bld: 128 mg/dL — ABNORMAL HIGH (ref 70–99)
Potassium: 3.1 mmol/L — ABNORMAL LOW (ref 3.5–5.1)
Sodium: 135 mmol/L (ref 135–145)

## 2021-06-11 LAB — MAGNESIUM: Magnesium: 2.2 mg/dL (ref 1.7–2.4)

## 2021-06-11 MED ORDER — POTASSIUM CHLORIDE CRYS ER 20 MEQ PO TBCR
20.0000 meq | EXTENDED_RELEASE_TABLET | Freq: Every day | ORAL | Status: DC
Start: 2021-06-12 — End: 2021-06-11

## 2021-06-11 MED ORDER — AMLODIPINE BESYLATE 10 MG PO TABS
10.0000 mg | ORAL_TABLET | Freq: Every day | ORAL | 1 refills | Status: DC
  Filled 2021-06-11: qty 30, 30d supply, fill #0

## 2021-06-11 MED ORDER — HYDROCODONE-ACETAMINOPHEN 5-325 MG PO TABS
1.0000 | ORAL_TABLET | ORAL | 0 refills | Status: DC | PRN
Start: 2021-06-11 — End: 2021-10-08
  Filled 2021-06-11: qty 10, 2d supply, fill #0

## 2021-06-11 MED ORDER — PANTOPRAZOLE SODIUM 40 MG PO TBEC
40.0000 mg | DELAYED_RELEASE_TABLET | Freq: Every day | ORAL | 1 refills | Status: DC
  Filled 2021-06-11: qty 30, 30d supply, fill #0

## 2021-06-11 MED ORDER — CARVEDILOL 25 MG PO TABS
25.0000 mg | ORAL_TABLET | Freq: Two times a day (BID) | ORAL | 1 refills | Status: DC
  Filled 2021-06-11: qty 60, 30d supply, fill #0
  Filled 2021-07-18: qty 60, 30d supply, fill #1

## 2021-06-11 MED ORDER — HYDROCHLOROTHIAZIDE 12.5 MG PO TABS
12.5000 mg | ORAL_TABLET | Freq: Every day | ORAL | 1 refills | Status: DC
  Filled 2021-06-11: qty 30, 30d supply, fill #0

## 2021-06-11 MED ORDER — LISINOPRIL 40 MG PO TABS
40.0000 mg | ORAL_TABLET | Freq: Every day | ORAL | 1 refills | Status: DC
  Filled 2021-06-11: qty 30, 30d supply, fill #0

## 2021-06-11 MED ORDER — HYDROCHLOROTHIAZIDE 12.5 MG PO TABS: 12.5000 mg | ORAL_TABLET | Freq: Every day | ORAL | Status: DC

## 2021-06-11 MED ORDER — POTASSIUM CHLORIDE CRYS ER 20 MEQ PO TBCR
40.0000 meq | EXTENDED_RELEASE_TABLET | ORAL | Status: AC
  Administered 2021-06-11 (×2): 40 meq via ORAL
  Filled 2021-06-11 (×2): qty 2

## 2021-06-11 MED ORDER — SIMETHICONE 80 MG PO CHEW
160.0000 mg | CHEWABLE_TABLET | Freq: Four times a day (QID) | ORAL | 0 refills | Status: DC
  Filled 2021-06-11: qty 30, 4d supply, fill #0

## 2021-06-11 MED ORDER — POLYETHYLENE GLYCOL 3350 17 GM/SCOOP PO POWD
17.0000 g | Freq: Every day | ORAL | 0 refills | Status: DC | PRN
  Filled 2021-06-11: qty 238, 14d supply, fill #0

## 2021-06-11 MED ORDER — POTASSIUM CHLORIDE CRYS ER 20 MEQ PO TBCR
20.0000 meq | EXTENDED_RELEASE_TABLET | Freq: Every day | ORAL | 0 refills | Status: DC
  Filled 2021-06-11: qty 4, 4d supply, fill #0

## 2021-06-11 NOTE — Discharge Summary (Signed)
Physician Discharge Summary  ?Brian Cannon [id number] DOB: 1960/06/29 DOA: 06/06/2021 ? ?PCP: Pcp, No ? ?Admit date: 06/06/2021 ?Discharge date: 06/11/2021 ? ?Time spent: 60 minutes ? ?Recommendations for Outpatient Follow-up:  ?Follow-up with Dr. Rachel Moulds, DO to establish PCP on 07/01/2021.  On follow-up patient will need a basic metabolic profile, magnesium level checked to follow-up on electrolytes and renal function.  Patient's blood pressure need to be followed up upon with goal systolic blood < 123456. ?Follow-up with Dr. Gwenlyn Saran, vascular surgery in 4 weeks. ? ? ?Discharge Diagnoses:  ?Principal Problem: ?  Aortic dissection, abdominal ([place]) ?Active Problems: ?  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  Essential hypertension ?  Hyponatremia ?  Hypokalemia ?  Descending thoracic dissection ?  Thoracic aortic dissection ?  Hypomagnesemia ?  Abdominal cramps ?  Constipation ? ? ?Discharge Condition: Stable and improved ? ?Diet recommendation: Heart healthy ? ?Filed Weights  ? 06/06/21 0930 06/07/21 0500 06/08/21 0500  ?Weight: 77.1 kg 76.6 kg 77.1 kg  ? ? ?History of present illness:  ?HPI per Dr. Aileen Fass ?Brian Cannon is a 61 y.o. male past medical history of tobacco abuse for 40 years having abdominal pain 5 days prior to admission he relates pain which has progressively gotten worse is just constant radiates to his back a comfortable position he denies any fever cough or diarrhea. 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  ?Hospital Course:  ?#1 acute symptomatic uncomplicated thoracic and abdominal aortic aneurysm dissection, POA ?-Patient noted to have presented with progressive abdominal pain with  radiation to the back, uncomfortable position.  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 ?-Patient seen in consultation by vascular surgery who recommended medical management at this time with goal SBP , 120, HR < 80. ?-Repeat CTA 06/08/2021 " with no significant interval change" per vascular surgery. ?-Blood pressure improved on regimen of Coreg, Norvasc, HCTZ, lisinopril. ?-Esmolol drip weaned off on 06/09/2021.    ?-Patient's pain was managed and improved during the hospitalization ?-Patient also was seen by vascular surgery who followed the patient throughout the hospitalization.    ?-Vascular surgery recommended outpatient follow-up with Dr. Unk Lightning in 4 weeks with repeat CT angiogram chest abdomen and pelvis. ?-Patient improved clinically and was cleared by vascular surgery for discharge. ? ?2.  Abdominal cramps/?  Constipation ?-Patient with complaints of abdominal cramping in the morning of 06/10/2021. ?-Patient noted not to have had a bowel movement in 5 days. ?-Patient with some associated burping. ?-Patient given MiraLAX and subsequently placed on sorbitol as well as Senokot-S.   ?-Patient had good results on bowel regimen.   ?-Abdominal films obtained unremarkable.   ?-Patient improved clinically by day of discharge.   ?-Patient will be discharged home in stable and improved condition. ? ?3.  Hypertension ?-Patient placed on Coreg, Norvasc, HCTZ, lisinopril for blood pressure control which was controlled by day of discharge with goal systolic blood pressure < 120. ?-Outpatient follow-up with PCP. ? ?4.  Hypokalemia/hypomagnesemia ?-Repleted.   ?-Patient was discharged on K-Dur 20 milliequivalents daily  x4 days.   ?-We will need close outpatient follow-up for repeat labs. ?  ? ?Procedures: ?CT angiogram chest abdomen and pelvis  06/06/2021, 06/08/2021 ?CT abdomen and pelvis 06/06/2021 ?2D echo 06/07/2021 ?  ? ?Consultations: ?Vascular surgery: Dr. Unk Lightning 06/06/2021 ?  ? ?Discharge Exam: ?Vitals:  ? 06/11/21 0844 06/11/21 0918  ?BP: 107/64 103/63  ?Pulse: 71 76  ?Resp: 16 15  ?Temp: (!) 97.5 ?F (36.4 ?C) 99 ?F (37.2 ?C)  ?SpO2: 96% 97%  ? ? ?General: NAD ?Cardiovascular: Regular rate rhythm no murmurs rubs or gallops.  No JVD.  No lower extremity edema. ?Respiratory: Clear to auscultation bilaterally.  No wheezes, no crackles, no rhonchi. ? ?Discharge Instructions ? ? ?Discharge Instructions   ? ? Diet - low sodium heart healthy   Complete by: As directed ?  ? Increase activity slowly   Complete by: As directed ?  ? ?  ? ?Allergies as of 06/11/2021   ?No Known Allergies ?  ? ?  ?Medication List  ?  ? ?TAKE these medications   ? ?acetaminophen 325 MG tablet ?Commonly known as: TYLENOL ?Take 650 mg by mouth every 6 (six) hours as needed for mild pain. ?  ?amLODipine 10 MG tablet ?Commonly known as: NORVASC ?Take 1 tablet (10 mg total) by mouth daily. ?Start taking on: June 12, 2021 ?  ?carvedilol 25 MG tablet ?Commonly known as: COREG ?Take 1 tablet (25 mg total) by mouth 2 (two) times daily with a meal. ?  ?hydrochlorothiazide 12.5 MG tablet ?Commonly known as: HYDRODIURIL ?Take 1 tablet (12.5 mg total) by mouth daily. ?Start taking on: June 12, 2021 ?  ?HYDROcodone-acetaminophen 5-325 MG tablet ?Commonly known as: NORCO/VICODIN ?Take 1 tablet by mouth every 4 (four) hours as needed for severe pain. ?What changed: how much to take ?  ?ibuprofen 800 MG tablet ?Commonly known as: ADVIL ?Take 800 mg by mouth every 8 (eight) hours as needed for headache or mild pain. ?  ?lisinopril 40 MG tablet ?Commonly known as: ZESTRIL ?Take 1 tablet (40 mg total) by mouth daily. ?Start taking on: June 12, 2021 ?  ?multivitamin with minerals Tabs tablet ?Take 1 tablet by mouth daily. ?  ?pantoprazole 40 MG tablet ?Commonly known as: Protonix ?Take 1 tablet (40  mg total) by mouth daily. ?  ?polyethylene glycol powder 17 GM/SCOOP powder ?Commonly known as: GLYCOLAX/MIRALAX ?Take 17 g by mouth daily as needed for mild constipation. ?  ?potassium chloride SA 20 MEQ tablet ?Commonly known as: KLOR-CON M ?Take 1 tablet (20 mEq total) by mouth daily for 4 days. ?Start taking on: June 12, 2021 ?  ?V-R GAS RELIEF 80 MG chewable tablet ?Generic drug: simethicone ?Chew 2 tablets (160 mg total) by mouth 4 (four) times daily for 3 days. ?  ? ?  ? ?No Known Allergies ? Follow-up Information   ? ? Johnnye Sima, Amy, DO Follow up on 07/01/2021.   ?Specialty: Internal Medicine ?Why: 07/01/2021 @10 :00am-- hospital follow up and primary care ?Contact information: ?[address] ?[address] ?[place] [address] ?[phone] ? ? ?  ?  ? ? Broadus John, MD. Schedule an appointment as soon as possible for a visit in 4 week(s).   ?Specialty: Vascular Surgery ?Contact information: ?[address] ?[place] [address] ?[phone] ? ? ?  ?  ? ? [facility] patient Accounts and billing Follow up.   ?Contact information: ?[phone] ? ?  ?  ? ?  ?  ? ?  ? ? ? ?The  results of significant diagnostics from this hospitalization (including imaging, microbiology, ancillary and laboratory) are listed below for reference.   ? ?Significant Diagnostic Studies: ?CT ABDOMEN PELVIS W CONTRAST ? ?Result Date: 06/06/2021 ?CLINICAL DATA:  Bowel obstruction suspected. EXAM: CT ABDOMEN AND PELVIS WITH CONTRAST TECHNIQUE: Multidetector CT imaging of the abdomen and pelvis was performed using the standard protocol following bolus administration of intravenous contrast. RADIATION DOSE REDUCTION: This exam was performed according to the departmental dose-optimization program which includes automated exposure control, adjustment of the mA and/or kV according to patient size and/or use of iterative reconstruction technique. CONTRAST:  140mL OMNIPAQUE IOHEXOL 300 MG/ML  SOLN COMPARISON:  06/20/2017 FINDINGS: Lower chest:  Descending thoracic aortic dissection which is interval. No dilatation of the covered aorta. Fairly symmetric enhancement of the lumens. Hepatobiliary: Scattered small cystic densities in the liver.No evidence

## 2021-06-11 NOTE — Progress Notes (Signed)
Patient given discharge instructions and stated understanding. 

## 2021-06-11 NOTE — Plan of Care (Signed)

## 2021-06-11 NOTE — TOC Transition Note (Signed)
Transition of Care (TOC) - CM/SW Discharge Note ?Marvetta Gibbons Therapist, sports, BSN ?Transitions of Care ?Unit 4E- RN Case Manager ?See Treatment Team for direct phone #  ? ? ?Patient Details  ?Name: Brian Cannon ?MRN: [id number] ?Date of Birth: [birth year] ? ?Transition of Care (TOC) CM/SW Contact:  ?Dahlia Client, Romeo Rabon, RN ?Phone Number: ?06/11/2021, 12:17 PM ? ? ?Clinical Narrative:    ?Pt states he is ready to go home today- anticipating discharge today. Referral for PCP needs.  ?Appointment made for 4/19 w/ [employer]  ?CM spoke with pt at bedside- pt again confirmed he has signed up for insurance with his new job and should be getting his cards first of April. He would like to find PCP once he gets his cards to make sure primary care is in-network. Explained to pt that appointment has been made with [facility] clinic that he can check and see if it is in-network once he gets his info- if not he can cancel appointment and establish with another PCP. Pt voiced understanding.  ? ?Meds have been sent to [facility]- total cost $43. Discussed with pt cost for med- pt voiced that he can pay for his meds today without difficulty- and then will try to see if his insurance will reimburse him for the cost. Have notified [facility] and they will contact pt for payment and delivery of meds to bedside.  ? ?No further needs noted by TOC- number for accounts/billing provided to pt per request.  ? ? ?Final next level of care: Home/Self Care ?Barriers to Discharge: Barriers Resolved ? ? ?Patient Goals and CMS Choice ?Patient states their goals for this hospitalization and ongoing recovery are:: to return home once stable. ?  ?Choice offered to / list presented to : NA ? ?Discharge Placement ?  ?           ? Home  ?  ?  ?  ? ?Discharge Plan and Services ?In-house Referral: NA ?Discharge Planning Services: [facility], Follow-up appt scheduled ?Post Acute Care Choice: NA          ?DME Arranged: N/A ?DME Agency: NA ?   ?  ?  ?HH Arranged: NA ?[place] Agency: NA ?  ?  ?  ? ?Social Determinants of Health (SDOH) Interventions ?  ? ? ?Readmission Risk Interventions ? ?  06/11/2021  ? 12:17 PM  ?Readmission Risk Prevention Plan  ?Post Dischage Appt Complete  ?Medication Screening Complete  ?Transportation Screening Complete  ? ? ? ? ? ?

## 2021-06-11 NOTE — Progress Notes (Addendum)
?  Progress Note ? ? ? ?06/11/2021 ?8:06 AM ?* No surgery found * ? ?Subjective:  no pain this morning.  Wants to go home ? ? ?Vitals:  ? 06/10/21 2329 06/11/21 0248  ?BP: (!) 104/59 99/60  ?Pulse:  84  ?Resp: 18 18  ?Temp: 98.1 ?F (36.7 ?C) 98 ?F (36.7 ?C)  ?SpO2: 98% 97%  ? ?Physical Exam: ?Lungs:  non labored ?Extremities:  feet warm and well perfused ?Abdomen:  soft, NT, ND ?Neurologic: A&O ? ?CBC ?   ?Component Value Date/Time  ? WBC 6.7 06/11/2021 0157  ? RBC 3.86 (L) 06/11/2021 0157  ? HGB 12.8 (L) 06/11/2021 0157  ? HCT 36.7 (L) 06/11/2021 0157  ? PLT 258 06/11/2021 0157  ? MCV 95.1 06/11/2021 0157  ? MCH 33.2 06/11/2021 0157  ? MCHC 34.9 06/11/2021 0157  ? RDW 12.8 06/11/2021 0157  ? LYMPHSABS 2.6 10/14/2013 1719  ? MONOABS 0.6 10/14/2013 1719  ? EOSABS 0.0 10/14/2013 1719  ? BASOSABS 0.0 10/14/2013 1719  ? ? ?BMET ?   ?Component Value Date/Time  ? NA 135 06/11/2021 0157  ? K 3.1 (L) 06/11/2021 0157  ? CL 98 06/11/2021 0157  ? CO2 27 06/11/2021 0157  ? GLUCOSE 128 (H) 06/11/2021 0157  ? BUN 9 06/11/2021 0157  ? CREATININE 1.04 06/11/2021 0157  ? CALCIUM 9.0 06/11/2021 0157  ? GFRNONAA >60 06/11/2021 0157  ? GFRAA >60 06/20/2017 1502  ? ? ?INR ?   ?Component Value Date/Time  ? INR 1.0 06/06/2021 1015  ? ? ? ?Intake/Output Summary (Last 24 hours) at 06/11/2021 0806 ?Last data filed at 06/10/2021 1700 ?Gross per 24 hour  ?Intake 755.32 ml  ?Output --  ?Net 755.32 ml  ? ? ? ?Assessment/Plan:  61 y.o. male with type B aortic dissection ? ?No signs or symptoms of malperfusion on exam ?BP better controlled with current regimen ?Office will arrange CTA c/a/p in 4 weeks; ok for discharge from vascular standpoint ? ? ?Emilie Rutter, PA-C ?[facility] ?[phone] ?06/11/2021 ?8:06 AM ? ?I have independently interviewed and examined patient and agree with PA assessment and plan above.  ? ?Adyline Huberty C. Randie Heinz, MD ?[facility] ?Office: [phone] ?Pager: [phone] ? ? ? ?

## 2021-06-24 NOTE — Progress Notes (Signed)
?Subjective:  ? ? Brian Cannon - 61 y.o. male MRN [id number]  Date of birth: 15-Aug-1960 ? ?HPI ? ?Brian Cannon is to establish care and hospital discharge follow-up.  ? ?Current issues and/or concerns: ?HOSPITAL DISCHARGE FOLLOW-UP: ?06/06/2021 - 06/11/2021 [facility] per MD note: ?Admit date: 06/06/2021 ?Discharge date: 06/11/2021 ?  ?Time spent: 60 minutes ?  ?Recommendations for Outpatient Follow-up:  ?Follow-up with Dr. Rachel Moulds, DO to establish PCP on 07/01/2021.  On follow-up patient will need a basic metabolic profile, magnesium level checked to follow-up on electrolytes and renal function.  Patient's blood pressure need to be followed up upon with goal systolic blood < 123456. ?Follow-up with Dr. Gwenlyn Saran, vascular surgery in 4 weeks. ?  ?Discharge Diagnoses:  ?Principal Problem: ?  Aortic dissection, abdominal ([place]) ?Active Problems: ?  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  Essential hypertension ?  Hyponatremia ?  Hypokalemia ?  Descending thoracic dissection ?  Thoracic aortic dissection ?  Hypomagnesemia ?  Abdominal cramps ?  Constipation ? ?History of present illness:  ?HPI per Dr. Aileen Fass ?Brian Cannon is a 61 y.o. male past medical history of tobacco abuse for 40 years having abdominal pain 5 days prior to admission he relates pain which has progressively gotten worse is just constant radiates to his back a comfortable position he denies any fever cough or diarrhea. 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  ?Hospital Course:  ?#1 acute symptomatic uncomplicated thoracic and abdominal aortic aneurysm dissection, POA ?-Patient noted to have presented with progressive abdominal pain  with radiation to the back, uncomfortable position.  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 ?-Patient seen in consultation by vascular surgery who recommended medical management at this time with goal SBP , 120, HR < 80. ?-Repeat CTA 06/08/2021 " with no significant interval change" per vascular surgery. ?-Blood pressure improved on regimen of Coreg, Norvasc, HCTZ, lisinopril. ?-Esmolol drip weaned off on 06/09/2021.    ?-Patient's pain was managed and improved during the hospitalization ?-Patient also was seen by vascular surgery who followed the patient throughout the hospitalization.    ?-Vascular surgery recommended outpatient follow-up with Dr. Unk Lightning in 4 weeks with repeat CT angiogram chest abdomen and pelvis. ?-Patient improved clinically and was cleared by vascular surgery for discharge. ? ?2.  Abdominal cramps/?  Constipation ?-Patient with complaints of abdominal cramping in the morning of 06/10/2021. ?-Patient noted not to have had a bowel movement in 5 days. ?-Patient with some associated burping.-Patient given MiraLAX and subsequently placed on sorbitol as well as Senokot-S.   ?-Patient had good results on bowel regimen.   ?-Abdominal films obtained unremarkable.   ? ?-Patient improved clinically by day of discharge.   ?-Patient will be discharged home in stable and improved condition. ? ?3.  Hypertension ?-Patient placed on Coreg, Norvasc, HCTZ, lisinopril for blood pressure control which was controlled by day of discharge with goal systolic blood pressure < 120. ?-Outpatient follow-up with PCP. ? ?4.  Hypokalemia/hypomagnesemia ?-Repleted.   ?-Patient was discharged on K-Dur 20 milliequivalents  daily x4 days.   ?-We will need close outpatient follow-up for repeat labs. ?  ?  ?Procedures: ?CT angiogram chest abdomen and  pelvis 06/06/2021, 06/08/2021 ?CT abdomen and pelvis 06/06/2021 ?2D echo 06/07/2021 ?  ?  ?Consultations: ?Vascular surgery: Dr. Unk Lightning 06/06/2021 ?   ?  ?Follow-Ups: ?Schedule an appointment with Broadus John, MD (Vascular Surgery) in 4 weeks (07/09/2021) ? ?TODAY'S VISIT: ?1. Acute symptomatic uncomplicated thoracic and abdominal aortic aneurysm dissection, POA follow-up: ?2.  Abdominal cramps/constipation follow-up: ?Feeling better since hospital discharge but not back to 100%. Reports having intermittent abdominal discomfort. Described as nervous stomach with cramps. Rubbing stomach helps to soothe. Feels gastric pain radiating to left chest. Was taking Hydrocodone-Acetaminophen to help. Now taking Tylenol to help. Thinks abdominal discomfort made worse by things that he eats. Reports looked on [employer] and considered if he has Crohn's disease. Feels weak with walking short distances. Aware of appointment 07/24/2021 with Vascular Surgery.  ? ?3.  Hypertension follow-up: ?Med Adherence: [x]  Yes    []  No ?Medication side effects: []  Yes    [x]  No ?Adherence with salt restriction (low-salt diet): sometimes adds a little salt ?Exercise: Yes []  No [x]   ?Home Monitoring?: [x]  Yes    []  No ?Monitoring Frequency: [x]  Yes    []  No ?Home BP results range: [x]  Yes, 110's/70's ?Smoking []  Yes [x]  No ?SOB? sometimes  ?Chest Pain?: []  Yes    [x]  No ?Leg swelling?: []  Yes    []  No ?Headaches?: []  Yes    []  No ?Dizziness? []  Yes    []  No ?Comments:  ? ? ?ROS per HPI  ? ? ?Health Maintenance:  ?Health Maintenance Due  ?Topic Date Due  ? COVID-19 Vaccine (1) Never done  ? Hepatitis C Screening  Never done  ? TETANUS/TDAP  Never done  ? COLONOSCOPY (Pts 45-25yrs Insurance coverage will need to be confirmed)  Never done  ? Zoster Vaccines- Shingrix (1 of 2) Never done  ? ? ? ?Past Medical History: ?Patient Active Problem List  ? Diagnosis Date Noted  ? Hypomagnesemia 06/10/2021  ? Abdominal cramps 06/10/2021  ? Constipation 06/10/2021   ? 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 Essential hypertension 06/06/2021  ? Hyponatremia 06/06/2021  ? Hypokalemia 06/06/2021  ? 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 Aortic dissection, abdominal ([place]) 06/06/2021  ? Thoracic aortic dissection ([place]) 06/06/2021  ? ? ?Social History  ? reports that he has quit smoking. His smoking use included cigarettes. He smoked an average of 1 pack per day. He has been exposed to tobacco smoke. He has never used smokeless tobacco. He reports current alcohol use of about 1.0 standard drink per week. He reports that he does not use drugs.  ? ?Family History  ?family history includes Heart attack in his mother; Prostate cancer in his father.  ? ?Medications: reviewed and updated ?  ?Objective:  ? Physical Exam ?BP 113/74 (BP Location: Left Arm, Patient Position: Sitting, Cuff Size: Large)   Pulse 72   Temp 98.5 ?F (36.9 ?C)   Resp 18   Ht 6' 0.05" (1.83 m)   Wt 166 lb (75.3 kg)   SpO2 98%   BMI 22.48 kg/m?  ? ?Physical Exam ?HENT:  ?   Head: Normocephalic and atraumatic.  ?Eyes:  ?   Extraocular Movements: Extraocular movements intact.  ?   Conjunctiva/sclera: Conjunctivae normal.  ?   Pupils: Pupils are equal, round, and reactive to light.  ?Cardiovascular:  ?  Rate and Rhythm: Normal rate and regular rhythm.  ?   Pulses: Normal pulses.  ?   Heart sounds: Normal heart sounds.  ?Pulmonary:  ?   Effort: Pulmonary effort is normal.  ?   Breath sounds: Normal breath sounds.  ?Musculoskeletal:  ?   Cervical back: Normal range of motion and neck supple.  ?Neurological:  ?   General: No focal deficit present.  ?   Mental Status: He is alert and oriented to person, place, and time.  ?Psychiatric:     ?   Mood and Affect: Mood normal.     ?   Behavior: Behavior normal.  ? ?   ?Assessment & Plan:  ?1. Encounter to establish care: ?- Patient presents today to establish care.  ?- Return for annual physical examination, labs, and health maintenance. ? ?2.  Hospital discharge follow-up: ?- Reviewed hospital course, current medications, ensured proper follow-up in place, and addressed concerns.  ? ?3. Aortic dissection, abdominal ([place]): ?4. Abdominal aortic aneurysm

## 2021-06-25 ENCOUNTER — Other Ambulatory Visit: Payer: Self-pay

## 2021-06-25 DIAGNOSIS — I71019 Dissection of thoracic aorta, unspecified: Secondary | ICD-10-CM

## 2021-07-01 ENCOUNTER — Encounter: Payer: Self-pay | Admitting: Family

## 2021-07-01 ENCOUNTER — Ambulatory Visit (INDEPENDENT_AMBULATORY_CARE_PROVIDER_SITE_OTHER): Payer: 59 | Admitting: Family

## 2021-07-01 VITALS — BP 113/74 | HR 72 | Temp 98.5°F | Resp 18 | Ht 72.05 in | Wt 166.0 lb

## 2021-07-01 DIAGNOSIS — Z09 Encounter for follow-up examination after completed treatment for conditions other than malignant neoplasm: Secondary | ICD-10-CM | POA: Diagnosis not present

## 2021-07-01 DIAGNOSIS — Z7689 Persons encountering health services in other specified circumstances: Secondary | ICD-10-CM | POA: Diagnosis not present

## 2021-07-01 DIAGNOSIS — I71012 Dissection of descending thoracic aorta: Secondary | ICD-10-CM

## 2021-07-01 DIAGNOSIS — E876 Hypokalemia: Secondary | ICD-10-CM

## 2021-07-01 DIAGNOSIS — Z8249 Family history of ischemic heart disease and other diseases of the circulatory system: Secondary | ICD-10-CM

## 2021-07-01 DIAGNOSIS — I7102 Dissection of abdominal aorta: Secondary | ICD-10-CM

## 2021-07-01 DIAGNOSIS — E871 Hypo-osmolality and hyponatremia: Secondary | ICD-10-CM

## 2021-07-01 DIAGNOSIS — R079 Chest pain, unspecified: Secondary | ICD-10-CM

## 2021-07-01 DIAGNOSIS — I10 Essential (primary) hypertension: Secondary | ICD-10-CM

## 2021-07-01 DIAGNOSIS — K5900 Constipation, unspecified: Secondary | ICD-10-CM

## 2021-07-01 DIAGNOSIS — I71019 Dissection of thoracic aorta, unspecified: Secondary | ICD-10-CM

## 2021-07-01 DIAGNOSIS — R109 Unspecified abdominal pain: Secondary | ICD-10-CM

## 2021-07-01 MED ORDER — SIMETHICONE 80 MG PO CHEW: 160.0000 mg | CHEWABLE_TABLET | Freq: Four times a day (QID) | ORAL | 0 refills | Status: DC

## 2021-07-01 NOTE — Progress Notes (Signed)
Pt presents to establish care and hospitalization f/u ?Needs refills on simethicone and Klor-Con  ?

## 2021-07-02 ENCOUNTER — Other Ambulatory Visit: Payer: Self-pay | Admitting: Family

## 2021-07-02 DIAGNOSIS — E876 Hypokalemia: Secondary | ICD-10-CM

## 2021-07-02 LAB — BASIC METABOLIC PANEL
BUN/Creatinine Ratio: 13 (ref 10–24)
BUN: 13 mg/dL (ref 8–27)
CO2: 24 mmol/L (ref 20–29)
Calcium: 9.4 mg/dL (ref 8.6–10.2)
Chloride: 100 mmol/L (ref 96–106)
Creatinine, Ser: 0.97 mg/dL (ref 0.76–1.27)
Sodium: 145 mmol/L — ABNORMAL HIGH (ref 134–144)
eGFR: 88 mL/min/{1.73_m2} (ref >59)

## 2021-07-02 LAB — MAGNESIUM: Magnesium: 2.1 mg/dL (ref 1.6–2.3)

## 2021-07-02 NOTE — Progress Notes (Deleted)
Cardiology Office Note:    Date:  07/02/2021   ID:  [name], DOB 29-Feb-1961, MRN [id number]  PCP:  Pcp, No   [facility] Providers Cardiologist:  None { Click to update primary MD,subspecialty MD or APP then REFRESH:1}    Referring MD: Rema Fendt, NP   No chief complaint on file. Hospital FU  History of Present Illness:    Brian Cannon is a 61 y.o. male with a hx of type B aortic dissection , referral to cardiology for BP management  See DC summary 06/01/2021 for details. Mr. Cardella presented 06/06/2021 with abdominal pain and pain that radiates to his back. CT scan showed aortic dissection extending aortic isthmus to the bifurcation with symmetric movement enhancement, there is an anterior flap proximal to the celiac and SMA with 45% SMA origin stenosis.  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 Vascular consulted and recommended repeat CTA (below), showing no significant change , plan was for BP control.    CT Chest/abd/pelvis 06/08/2021 1. Stable thoracoabdominal aortic dissection, extending from the distal aortic arch to the aortic bifurcation. Stable fairly symmetrical enhancement of the true and false lumen. 2. Celiac, SMA, and right renal artery arise from the true lumen. Left renal artery arises from the false lumen, with slight delayed enhancement of the left renal parenchyma on this arterial phase exam. 3. Resolution of the narrowing of the common hepatic artery origin seen previously, likely due to previous vascular spasm. 4. Mild narrowing at the origin of the celiac and SMA, estimated less than 50%. 5. Chronic occlusion of the IMA, with distal reconstitution via collateral flow. 6. Stable caliber of the thoracoabdominal aorta, with chronic wall thickening of the infrarenal aorta at the level the bifurcation. No evidence of aortic rupture. 7. Stable atherosclerosis of the bilateral common iliac arteries, with estimated 50-70% stenosis on  the left.   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  Current Medications: No outpatient medications have been marked as taking for the 07/03/21 encounter (Appointment) with Maisie Fus, MD.     Allergies:   Patient has no known allergies.   Social History   Socioeconomic History   Marital status: Single    Spouse name: Not on file   Number of children: Not on file   Years of education: Not on file   Highest education level: Not on file  Occupational History   Not on file  Tobacco Use   Smoking status: Former    Packs/day: 1.00    Types: Cigarettes    Passive exposure: Past   Smokeless tobacco: Never  Vaping Use   Vaping Use: Never used  Substance and Sexual Activity   Alcohol use: Yes    Alcohol/week: 1.0 standard drink    Types: 1 Cans of beer per week   Drug use: No   Sexual activity: Yes  Other Topics Concern   Not on file  Social History Narrative   Not on file   Social Determinants of Health   Financial Resource Strain: Not on file  Food Insecurity: Not on file  Transportation Needs: Not on file  Physical Activity: Not on file  Stress: Not on file  Social Connections: Not on file     Family History: The patient's ***family history includes Heart attack in his mother; Prostate cancer in his father.  ROS:  Please see the history of present illness.    *** All other systems reviewed and are negative.  EKGs/Labs/Other Studies Reviewed:    The following studies were reviewed today: ***  EKG:  EKG is *** ordered today.  The ekg ordered today demonstrates ***  Recent Labs: 06/07/2021: ALT 13 06/11/2021: Hemoglobin 12.8; Platelets 258 07/01/2021: BUN 13; Creatinine, Ser 0.97; Magnesium 2.1; Potassium CANCELED; Sodium 145  Recent Lipid Panel No results found for: CHOL, TRIG, HDL, CHOLHDL, VLDL, LDLCALC, LDLDIRECT   Risk Assessment/Calculations:    {Does this patient have ATRIAL FIBRILLATION?[phone]}       Physical Exam:    VS:  There were no vitals taken for this visit.    Wt Readings from Last 3 Encounters:  07/01/21 166 lb (75.3 kg)  06/08/21 169 lb 15.6 oz (77.1 kg)     GEN: *** Well nourished, well developed in no acute distress HEENT: Normal NECK: No JVD; No carotid bruits LYMPHATICS: No lymphadenopathy CARDIAC: ***RRR, no murmurs, rubs, gallops RESPIRATORY:  Clear to auscultation without rales, wheezing or rhonchi  ABDOMEN: Soft, non-tender, non-distended MUSCULOSKELETAL:  No edema; No deformity  SKIN: Warm and dry NEUROLOGIC:  Alert and oriented x 3 PSYCHIATRIC:  Normal affect   ASSESSMENT:    Type B dissection: This is stable. Goal will be for tight BP control. Followed by vascular sx Dr. Unk Lightning 06/06/2021. On BB and ACE-I, will continue this.   HTN: continue coreg 25 mg BID. Norvasc 10 mg. Continue HCTz 12.5 mg daily and lisinopril 40 mg daily.   PLAN:    In order of problems listed above:   Follow up 3 months BP monitoring with pharmacy Follow up 6 months      {Are you ordering a CV Procedure (e.g. stress test, cath, DCCV, TEE, etc)?   Press F2        :[id number]    Medication Adjustments/Labs and Tests Ordered: Current medicines are reviewed at length with the patient today.  Concerns regarding medicines are outlined above.  No orders of the defined types were placed in this encounter.  No orders of the defined types were placed in this encounter.   There are no Patient Instructions on file for this visit.   Signed, Janina Mayo, MD  07/02/2021 11:06 AM    [place]

## 2021-07-02 NOTE — Progress Notes (Signed)
Kidney function normal.  ? ?Magnesium back to normal.  ? ?All other values are normal, stable or within acceptable limits. ? ?Will need to recollect potassium per [facility]. Please call our office for scheduling. ? ?Camillia Herter, NP 07/02/2021 8:41 AM  ?

## 2021-07-03 ENCOUNTER — Ambulatory Visit: Payer: 59 | Admitting: Internal Medicine

## 2021-07-08 NOTE — Progress Notes (Signed)
?Cardiology Office Note:   ? ?Date:  07/09/2021  ? ?ID:  Brian Cannon, DOB 03-Apr-1960, MRN [id number] ? ?PCP:  Pcp, No ?  ?[facility] Providers ?Cardiologist:  Janina Mayo, MD    ? ?Referring MD: Camillia Herter, NP  ? ?No chief complaint on file. ?Aortic Dissection ? ?History of Present Illness:   ? ?Brian Cannon is a 61 y.o. male with a hx of smoker for 40 years, aortic dissection aortic isthmus to the bifurcation recommended to manage medically for type B dissection by vascular sx, referral for BP management ? ?Patient admitted in late March. Was managed on esmolol drip which was weaned. Bps were in a good range on discharge. Today he notes some chest discomfort in the left chest. He notes with laying flat. Not associated with activity. Peppermint improves it. Since he went into the hospital.  He notes some deconditioning with walking. He typically goes to the gym and does a lot of walking at his job. He can do > 4 mets without cp or sob ? ?He is planned for CT angio 07/22/2021.  ? ?He checks his blood pressures at home. Blood pressures one teens systolic, under 80 for diastolic.  ? ?No family hx of dissection. No drug use. Mother had  MI in her 84s. ? ?Cardiology Studies ?TTE 3 /26/2023- Ef 50-55 ? ?CT abd/pelvis 06/08/2021 ?  ?VASCULAR ?  ?Aorta: Thoracoabdominal aortic dissection extends to the level of ?the aortic bifurcation. The celiac, SMA, and right renal artery ?arise from the true lumen. Left renal artery arises from the false ?lumen. Fairly symmetrical enhancement of the true and false lumen. ?Stable mild mural thickening of the aortic bifurcation and proximal ?bilateral iliac arteries, without evidence of retroperitoneal ?hematoma or rupture. Prominent atherosclerosis of the distal aorta ?at the level of bifurcation. ?  ?Celiac: Mild narrowing at the origin of the celiac artery due to the ?anterior margin of the dissection flap. The narrowing of the common ?hepatic artery seen on prior study has  resolved, and may reflect ?spasm. No focal stenosis. ?  ?SMA: Mild narrowing at the origin the SMA due to the anterior aspect ?of the dissection flap, less than 50%. Remainder of the SMA is ?unremarkable. ?  ?Renals: Right renal is widely patent arising from the true lumen. ?The left renal is widely patent arising from the false lumen. No ?focal stenosis or renal artery dissection. ?  ?IMA: Chronic occlusion at its origin with distal reconstitution via ?collaterals from the SMA. ?  ?Inflow: Diffuse atherosclerosis throughout the bilateral common ?iliac arteries. Less than 50% narrowing at the origin of the right ?common iliac artery, with 50-70% narrowing at the origin of the left ?common iliac artery. These are stable findings. Bilateral external ?and internal iliac arteries are widely patent. ? ?Past Medical History:  ?Diagnosis Date  ? Back pain   ? ? ?No past surgical history on file. ? ?Current Medications: ?Current Meds  ?Medication Sig  ? acetaminophen (TYLENOL) 325 MG tablet Take 650 mg by mouth every 6 (six) hours as needed for mild pain.  ? carvedilol (COREG) 25 MG tablet Take 1 tablet (25 mg total) by mouth 2 (two) times daily with a meal.  ? HYDROcodone-acetaminophen (NORCO/VICODIN) 5-325 MG tablet Take 1 tablet by mouth every 4 (four) hours as needed for severe pain.  ? ibuprofen (ADVIL) 800 MG tablet Take 800 mg by mouth every 8 (eight) hours as needed for headache or mild pain.  ? Multiple Vitamin (MULTIVITAMIN  WITH MINERALS) TABS tablet Take 1 tablet by mouth daily.  ? pantoprazole (PROTONIX) 40 MG tablet Take 1 tablet (40 mg total) by mouth daily.  ? polyethylene glycol powder (GLYCOLAX/MIRALAX) 17 GM/SCOOP powder Take 17 g by mouth daily as needed for mild constipation.  ? potassium chloride SA (KLOR-CON M) 20 MEQ tablet Take 1 tablet (20 mEq total) by mouth daily for 4 days.  ? simethicone (MYLICON) 80 MG chewable tablet Chew 2 tablets (160 mg total) by mouth 4 (four) times daily for 3 days.  ?  [DISCONTINUED] amLODipine (NORVASC) 10 MG tablet Take 1 tablet (10 mg total) by mouth daily.  ? [DISCONTINUED] hydrochlorothiazide (HYDRODIURIL) 12.5 MG tablet Take 1 tablet (12.5 mg total) by mouth daily.  ? [DISCONTINUED] lisinopril (ZESTRIL) 40 MG tablet Take 1 tablet (40 mg total) by mouth daily.  ?  ? ?Allergies:   Patient has no known allergies.  ? ?Social History  ? ?Socioeconomic History  ? Marital status: Single  ?  Spouse name: Not on file  ? Number of children: Not on file  ? Years of education: Not on file  ? Highest education level: Not on file  ?Occupational History  ? Not on file  ?Tobacco Use  ? Smoking status: Former  ?  Packs/day: 1.00  ?  Types: Cigarettes  ?  Passive exposure: Past  ? Smokeless tobacco: Never  ?Vaping Use  ? Vaping Use: Never used  ?Substance and Sexual Activity  ? Alcohol use: Yes  ?  Alcohol/week: 1.0 standard drink  ?  Types: 1 Cans of beer per week  ? Drug use: No  ? Sexual activity: Yes  ?Other Topics Concern  ? Not on file  ?Social History Narrative  ? Not on file  ? ?Social Determinants of Health  ? ?Financial Resource Strain: Not on file  ?Food Insecurity: Not on file  ?Transportation Needs: Not on file  ?Physical Activity: Not on file  ?Stress: Not on file  ?Social Connections: Not on file  ?  ? ?Family History: ?The patient's family history includes Heart attack in his mother; Prostate cancer in his father. ? ?ROS:   ?Please see the history of present illness.    ? All other systems reviewed and are negative. ? ?EKGs/Labs/Other Studies Reviewed:   ? ?The following studies were reviewed today: ? ? ?EKG:  EKG is  ordered today.  The ekg ordered today demonstrates  ? ?07/09/2021-NSR ? ?Recent Labs: ?06/07/2021: ALT 13 ?06/11/2021: Hemoglobin 12.8; Platelets 258 ?07/01/2021: BUN 13; Creatinine, Ser 0.97; Magnesium 2.1; Potassium CANCELED; Sodium 145  ?Recent Lipid Panel ?No results found for: CHOL, TRIG, HDL, CHOLHDL, VLDL, LDLCALC, LDLDIRECT ? ? ?Risk Assessment/Calculations:    ?  ? ?    ? ?Physical Exam:   ? ?VS:   ? ?Vitals:  ? 07/09/21 0925  ?BP: 118/68  ?Pulse: 78  ?SpO2: 98%  ? ? ? ?Wt Readings from Last 3 Encounters:  ?07/09/21 170 lb (77.1 kg)  ?07/01/21 166 lb (75.3 kg)  ?06/08/21 169 lb 15.6 oz (77.1 kg)  ?  ? ?GEN:  Well nourished, well developed in no acute distress ?HEENT: Normal ?NECK: No JVD; No carotid bruits ?LYMPHATICS: No lymphadenopathy ?CARDIAC: RRR, no murmurs, rubs, gallops ?RESPIRATORY:  Clear to auscultation without rales, wheezing or rhonchi  ?ABDOMEN: Soft, non-tender, non-distended ?MUSCULOSKELETAL:  No edema; No deformity  ?SKIN: Warm and dry ?NEUROLOGIC:  Alert and oriented x 3 ?PSYCHIATRIC:  Normal affect  ? ?ASSESSMENT:   ? ?Type B  dissection: Chronic. BP goal closer to 120 mmHg. Continue coreg 25 mg BID, norvasc 10 mg daily, and lisinopril 40 mg daily.  Can increase HCTz to 25 mg daily if Bps rise above 120/80 mmHg. Recommend continuing to follow with vascular sx for surveillance. Discussed with him that if he develops cp or sob with activity to let us know for stress testing. Will continue to CVD risk mitigate. Getting lipid profile today. ? ? ?PLAN:   ? ?In order of problems listed above: ? ?Fasting lipids ?Follow up in 3 months ? ?   ? ?   ?Medication Adjustments/Labs and Tests Ordered: ?Current medicines are reviewed at length with the patient today.  Concerns regarding medicines are outlined above.  ?Orders Placed This Encounter  ?Procedures  ? Lipid panel  ? EKG 12-Lead  ? ?Meds ordered this encounter  ?Medications  ? amLODipine (NORVASC) 10 MG tablet  ?  Sig: Take 1 tablet (10 mg total) by mouth daily.  ?  Dispense:  90 tablet  ?  Refill:  3  ? lisinopril (ZESTRIL) 40 MG tablet  ?  Sig: Take 1 tablet (40 mg total) by mouth daily.  ?  Dispense:  90 tablet  ?  Refill:  3  ? hydrochlorothiazide (HYDRODIURIL) 12.5 MG tablet  ?  Sig: Take 1 tablet (12.5 mg total) by mouth daily.  ?  Dispense:  90 tablet  ?  Refill:  3  ? ? ?Patient Instructions   ?Medication Instructions:  ?Your physician recommends that you continue on your current medications as directed. Please refer to the Current Medication list given to you today. ? ?*If you need a refill on your

## 2021-07-09 ENCOUNTER — Ambulatory Visit (INDEPENDENT_AMBULATORY_CARE_PROVIDER_SITE_OTHER): Payer: 59 | Admitting: Internal Medicine

## 2021-07-09 ENCOUNTER — Encounter: Payer: Self-pay | Admitting: Internal Medicine

## 2021-07-09 VITALS — BP 118/68 | HR 78 | Ht 72.0 in | Wt 170.0 lb

## 2021-07-09 DIAGNOSIS — Z13220 Encounter for screening for lipoid disorders: Secondary | ICD-10-CM | POA: Diagnosis not present

## 2021-07-09 DIAGNOSIS — I10 Essential (primary) hypertension: Secondary | ICD-10-CM

## 2021-07-09 LAB — LIPID PANEL
Chol/HDL Ratio: 3.6 ratio (ref 0.0–5.0)
Cholesterol, Total: 196 mg/dL (ref 100–199)
HDL: 55 mg/dL (ref >39)
LDL Chol Calc (NIH): 119 mg/dL — ABNORMAL HIGH (ref 0–99)
Triglycerides: 121 mg/dL (ref 0–149)
VLDL Cholesterol Cal: 22 mg/dL (ref 5–40)

## 2021-07-09 MED ORDER — AMLODIPINE BESYLATE 10 MG PO TABS: 10.0000 mg | ORAL_TABLET | Freq: Every day | ORAL | 3 refills | Status: DC

## 2021-07-09 MED ORDER — HYDROCHLOROTHIAZIDE 12.5 MG PO TABS: 12.5000 mg | ORAL_TABLET | Freq: Every day | ORAL | 3 refills | Status: DC

## 2021-07-09 MED ORDER — LISINOPRIL 40 MG PO TABS: 40.0000 mg | ORAL_TABLET | Freq: Every day | ORAL | 3 refills | Status: DC

## 2021-07-09 NOTE — Patient Instructions (Signed)
Medication Instructions:  ?Your physician recommends that you continue on your current medications as directed. Please refer to the Current Medication list given to you today. ? ?*If you need a refill on your cardiac medications before your next appointment, please call your pharmacy* ? ?Follow-Up: ?At [facility], you and your health needs are our priority.  As part of our continuing mission to provide you with exceptional heart care, we have created designated Provider Care Teams.  These Care Teams include your primary Cardiologist (physician) and Advanced Practice Providers (APPs -  Physician Assistants and Nurse Practitioners) who all work together to provide you with the care you need, when you need it. ? ?We recommend signing up for the patient portal called "MyChart".  Sign up information is provided on this After Visit Summary.  MyChart is used to connect with patients for Virtual Visits (Telemedicine).  Patients are able to view lab/test results, encounter notes, upcoming appointments, etc.  Non-urgent messages can be sent to your provider as well.   ?To learn more about what you can do with MyChart, go to [email or web address].   ? ?Your next appointment:   ?3 month(s) ? ?The format for your next appointment:   ?In Person ? ?Provider:   ?Dr. Phineas Inches ? ?Important Information About Sugar ? ? ? ? ? ? ?

## 2021-07-10 ENCOUNTER — Other Ambulatory Visit: Payer: Self-pay | Admitting: *Deleted

## 2021-07-10 MED ORDER — ATORVASTATIN CALCIUM 20 MG PO TABS: 20.0000 mg | ORAL_TABLET | Freq: Every day | ORAL | 3 refills | Status: DC

## 2021-07-17 ENCOUNTER — Ambulatory Visit: Payer: Self-pay | Admitting: Vascular Surgery

## 2021-07-18 ENCOUNTER — Other Ambulatory Visit (HOSPITAL_COMMUNITY): Payer: Self-pay

## 2021-07-19 ENCOUNTER — Other Ambulatory Visit (HOSPITAL_COMMUNITY): Payer: Self-pay

## 2021-07-20 ENCOUNTER — Other Ambulatory Visit (HOSPITAL_COMMUNITY): Payer: Self-pay

## 2021-07-22 ENCOUNTER — Ambulatory Visit: Payer: Self-pay

## 2021-07-23 NOTE — Progress Notes (Signed)
Erroneous encounter-disregard

## 2021-07-24 ENCOUNTER — Other Ambulatory Visit: Payer: Self-pay

## 2021-07-24 ENCOUNTER — Ambulatory Visit: Payer: Self-pay | Admitting: Vascular Surgery

## 2021-07-24 DIAGNOSIS — I71019 Dissection of thoracic aorta, unspecified: Secondary | ICD-10-CM

## 2021-07-31 ENCOUNTER — Encounter: Payer: 59 | Admitting: Family

## 2021-07-31 DIAGNOSIS — Z131 Encounter for screening for diabetes mellitus: Secondary | ICD-10-CM

## 2021-07-31 DIAGNOSIS — Z0000 Encounter for general adult medical examination without abnormal findings: Secondary | ICD-10-CM

## 2021-07-31 DIAGNOSIS — Z1329 Encounter for screening for other suspected endocrine disorder: Secondary | ICD-10-CM

## 2021-07-31 DIAGNOSIS — Z13228 Encounter for screening for other metabolic disorders: Secondary | ICD-10-CM

## 2021-08-06 ENCOUNTER — Encounter: Payer: Self-pay | Admitting: Family

## 2021-08-11 NOTE — Progress Notes (Unsigned)
Office Note     HPI: Carless Fabris is a 61 y.o. 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   On exam today, Linton Rump was doing well.  Since his discharge, he has had a very low energy level, citing his current medication regimen which includes 3 antihypertensive agents.  Louis was very active prior to his acute dissection, going to the gym daily.  He has yet to return, and continues to refrain from heavy lifting.  Louis has appreciated new, bilateral lower extremity claudication.  He appreciates this when walking outside to his mailbox and back, with accompanying weakness.  Louis denies new onset chest pain, back pain, abdominal pain.  He has a normal appetite and normal bowel movements.  Medications have decreased his libido significantly, unable to be intimate with his girlfriend.  Unfortunately, Johnny was recently let go from his job due to activity restrictions from his TBAD.  He is currently in the process of filing for disability.  The pt is  on a statin for cholesterol management.  The pt is  on a daily aspirin.   Other AC:  - The pt is  on medication for hypertension.   The pt is not diabetic.  Tobacco hx:  -  Past Medical History:  Diagnosis Date   Back pain     No past surgical history on file.  Social History   Socioeconomic History   Marital status: Single    Spouse name: Not on file   Number of children: Not on file   Years of education: Not on file   Highest education level: Not on file  Occupational History   Not on file  Tobacco Use   Smoking status: Former    Packs/day: 1.00    Types: Cigarettes    Passive exposure: Past   Smokeless tobacco: Never  Vaping Use   Vaping Use: Never used  Substance and Sexual Activity   Alcohol use: Yes    Alcohol/week: 1.0 standard drink    Types: 1 Cans of beer per week   Drug use: No   Sexual activity: Yes  Other Topics Concern   Not on file  Social  History Narrative   Not on file   Social Determinants of Health   Financial Resource Strain: Not on file  Food Insecurity: Not on file  Transportation Needs: Not on file  Physical Activity: Not on file  Stress: Not on file  Social Connections: Not on file  Intimate Partner Violence: Not on file   Family History  Problem Relation Age of Onset   Heart attack Mother    Prostate cancer Father     Current Outpatient Medications  Medication Sig Dispense Refill   acetaminophen (TYLENOL) 325 MG tablet Take 650 mg by mouth every 6 (six) hours as needed for mild pain.     amLODipine (NORVASC) 10 MG tablet Take 1 tablet (10 mg total) by mouth daily. 90 tablet 3   atorvastatin (LIPITOR) 20 MG tablet Take 1 tablet (20 mg total) by mouth daily. 90 tablet 3   carvedilol (COREG) 25 MG tablet Take 1 tablet (25 mg total) by mouth 2 (two) times daily with a meal. 60 tablet 1   hydrochlorothiazide (HYDRODIURIL) 12.5 MG tablet Take 1 tablet (12.5 mg total) by mouth daily. 90 tablet 3   HYDROcodone-acetaminophen (NORCO/VICODIN) 5-325 MG tablet Take 1 tablet by mouth every 4 (four) hours as needed for severe pain. 10 tablet  0   ibuprofen (ADVIL) 800 MG tablet Take 800 mg by mouth every 8 (eight) hours as needed for headache or mild pain.     lisinopril (ZESTRIL) 40 MG tablet Take 1 tablet (40 mg total) by mouth daily. 90 tablet 3   Multiple Vitamin (MULTIVITAMIN WITH MINERALS) TABS tablet Take 1 tablet by mouth daily.     pantoprazole (PROTONIX) 40 MG tablet Take 1 tablet (40 mg total) by mouth daily. 30 tablet 1   polyethylene glycol powder (GLYCOLAX/MIRALAX) 17 GM/SCOOP powder Take 17 g by mouth daily as needed for mild constipation. 238 g 0   potassium chloride SA (KLOR-CON M) 20 MEQ tablet Take 1 tablet (20 mEq total) by mouth daily for 4 days. 4 tablet 0   simethicone (MYLICON) 80 MG chewable tablet Chew 2 tablets (160 mg total) by mouth 4 (four) times daily for 3 days. 30 tablet 0   No current  facility-administered medications for this visit.    No Known Allergies   REVIEW OF SYSTEMS:  [X]  denotes positive finding, [ ]  denotes negative finding Cardiac  Comments:  Chest pain or chest pressure:    Shortness of breath upon exertion:    Short of breath when lying flat:    Irregular heart rhythm:        Vascular    Pain in calf, thigh, or hip brought on by ambulation: X   Pain in feet at night that wakes you up from your sleep:     Blood clot in your veins:    Leg swelling:         Pulmonary    Oxygen at home:    Productive cough:     Wheezing:         Neurologic    Sudden weakness in arms or legs:     Sudden numbness in arms or legs:     Sudden onset of difficulty speaking or slurred speech:    Temporary loss of vision in one eye:     Problems with dizziness:         Gastrointestinal    Blood in stool:     Vomited blood:         Genitourinary    Burning when urinating:     Blood in urine:        Psychiatric    Major depression:         Hematologic    Bleeding problems:    Problems with blood clotting too easily:        Skin    Rashes or ulcers:        Constitutional    Fever or chills:      PHYSICAL EXAMINATION:  There were no vitals filed for this visit.  General:  WDWN in NAD; vital signs documented above Gait: Not observed HENT: WNL, normocephalic Pulmonary: normal non-labored breathing , without wheezing Cardiac: regular HR Abdomen: soft, NT, no masses Skin: without rashes Vascular Exam/Pulses:  Right Left  Radial 2+ (normal) 2+ (normal)  Ulnar 2+ (normal) 2+ (normal)  Femoral 1+ (weak) 1+ (weak)  Popliteal    DP absent absent  PT absent absent   Extremities: without ischemic changes, without Gangrene , without cellulitis; without open wounds;  Musculoskeletal: no muscle wasting or atrophy  Neurologic: A&O X 3;  No focal weakness or paresthesias are detected Psychiatric:  The pt has Normal affect.   Non-Invasive Vascular  Imaging:     Interval imaging of the chest abdomen pelvis  demonstrates no aneurysmal dilatation of the aorta, however there is significant narrowing in the infrarenal segment from the false lumen.  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  Both renal arteries are patent with the left renal artery being perfused off of the false lumen.    ASSESSMENT/PLAN: Kaiyan Laing is a 61 y.o. 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  Current symptoms are limited to claudication in bilateral lower extremities from infrarenal compression by the false lumen.  Otherwise, Joshuah remains asymptomatic.  His poor energy level, low libido, mild malaise, are a product of his antihypertensive regimen.   My main concern, is that he was an active 61 year old prior to his acute dissection and has been unable to return to the gym, or walk without symptoms of claudication.  From a repair standpoint, the dissection flap is usually more mobile in the subacute setting is beneficial for stent graft placement.  Deryk and I discussed both continued medical management versus operative repair. Operative repair would involve left carotid subclavian bypass versus transposition, TEVAR.  He is aware that in the future he may require further aortic surgery if aneurysmal dilation occurs.   I plan on discussing Kody's pathology with my partners and asked him to return to the office in 2-3 weeks to further our discussion.   Broadus John, MD [facility] [phone]

## 2021-08-13 ENCOUNTER — Other Ambulatory Visit (HOSPITAL_COMMUNITY): Payer: 59

## 2021-08-13 ENCOUNTER — Ambulatory Visit (HOSPITAL_COMMUNITY): Payer: 59

## 2021-08-14 ENCOUNTER — Ambulatory Visit (INDEPENDENT_AMBULATORY_CARE_PROVIDER_SITE_OTHER): Payer: 59 | Admitting: Vascular Surgery

## 2021-08-14 ENCOUNTER — Encounter: Payer: Self-pay | Admitting: Vascular Surgery

## 2021-08-14 VITALS — BP 123/78 | HR 73 | Temp 98.3°F | Resp 20 | Ht 72.0 in | Wt 169.0 lb

## 2021-08-14 DIAGNOSIS — I71019 Dissection of thoracic aorta, unspecified: Secondary | ICD-10-CM

## 2021-09-01 ENCOUNTER — Telehealth: Payer: Self-pay | Admitting: Internal Medicine

## 2021-09-01 ENCOUNTER — Telehealth: Payer: Self-pay

## 2021-09-01 NOTE — Telephone Encounter (Signed)
Called spoke to patient . Patient states he has only been taking Carvedilol 25 mg twice a day. Patient states he is new at taking medication . He did not know if he should continue with medication since he did not have anymore in the bottles. Patient also wanted to know if he should be taking something before seeing Vascular surgeon on September 11, 2021   RN  reviewed  patient medication list  patient should be taking  Amlodipine 10 mg daily , Hydrochlorothiazide 12.5 mg daily , lisinopril 40 mg daily.  RN informed patient that Dr Wyline Mood was under the impression that he was taking all the medication .  Patient stated he received a message from [facility] about medication but he id not go a to pharmacy  to find out what medication where there to be picked up.    RN educated patient . Marland Kitchen Medication should be taken until doctor or office tells him otherwise.  Instructed patient to call pharamcy to see what medication are there to be picked up - and the go and pick them up and start taking.   RN told patient will call him back to confirm he has the medications.

## 2021-09-01 NOTE — Telephone Encounter (Addendum)
Patient called to check on status of his call.  He said his BP at 2pm was 138/98.

## 2021-09-01 NOTE — Telephone Encounter (Signed)
Spoke to patient. Patient states he spoke to pharmacy and they will have medication available at 5  pm for pick up   Also informed patient he will need atorvastatin 20 mg refilled . Patient stated he did not have any medication other than Carvedilol .

## 2021-09-01 NOTE — Telephone Encounter (Signed)
Pt called stating that he was having increased BP readings at home and wanted to know if Dr Karin Lieu needed to know and if that changed his upcoming surgical consult.  Returned pt's call, two identifiers used. Pt stated that he stopped taking the Carvedilol d/t side effects and noted that his BP had started to increase, 160's/100's. He restarted the Carvedilol on Sunday and it seems to be helping, although he still has readings in the 140's/90's-100's, but he's only taking it daily instead of BID. Dr Carolan Clines saw pt at [facility] on 07/09/21 and prescribed 3 other BP meds and a statin. Pt has not been taking those. Instructed pt to call that office and inform them of his BP readings and which meds he's been taking and how. Pt confirmed understanding and is reaching out to [facility] now.

## 2021-09-01 NOTE — Telephone Encounter (Signed)
Pt c/o BP issue: STAT if pt c/o blurred vision, one-sided weakness or slurred speech  1. What are your last 5 BP readings?  08/31/21 8:00 AM 169/106 8:15 AM 149/99 8:40 AM 147/104 3:30 PM 148/93 09/01/21 10:00 AM 169/111 took 25 min walk 10:30 AM 144/103  2. Are you having any other symptoms (ex. Dizziness, headache, blurred vision, passed out)? Dizziness, but states that comes from carvedilol.   3. What is your BP issue? Hypertension.     Pt c/o medication issue:  1. Name of Medication: carvedilol (COREG) 25 MG tablet  2. How are you currently taking this medication (dosage and times per day)? Two tablets daily 10 hrs apart.   3. Are you having a reaction (difficulty breathing--STAT)? Yes  4. What is your medication issue? Patient states this medication causes dizziness, chest aching/irritation (states it is not painful), diarrhea, and excessive fatigue. He reports when he massages the left side of his chest the irritation/aching gets better & declines currently having the aching/irritation now. Patient stopped taking two tablets daily on 06/02 due to his symptoms. After decreasing medication symptoms have lessoned, but due to BP becoming elevated he began taking two tablets daily again as of yesterday. Please advise.

## 2021-09-02 NOTE — Telephone Encounter (Signed)
Maisie Fus, MD  Tobin Chad, RN 4 hours ago (7:51 AM)   MB Ok. I appreciate it. Thank you !    Tobin Chad, RN  Maisie Fus, MD 19 hours ago (4:39 PM)     Fyi---I did not  instructed patient on any medication changes because he was not taking the medications as prescribed. He only taking Carvedilol twice a day with other .

## 2021-09-04 ENCOUNTER — Telehealth: Payer: Self-pay | Admitting: Internal Medicine

## 2021-09-04 MED ORDER — CARVEDILOL 25 MG PO TABS: 25.0000 mg | ORAL_TABLET | Freq: Two times a day (BID) | ORAL | 1 refills | Status: DC

## 2021-09-04 NOTE — Telephone Encounter (Signed)
Refill sent to pharmacy.   

## 2021-09-09 NOTE — Progress Notes (Signed)
Office Note     HPI: Brian Cannon is a 61 y.o. 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  I asked him to return to clinic after I could discuss the case my partners in order to provide him with the best treatment option moving forward.  On exam today, Brian Cannon was doing well.  He was accompanied by one of his daughters.  Continues to have anxiety and blood pressure control issues.  Last week, he was noted to be hypertensive and restarted some of his home medications.  Brian Cannon has been unable to return to the gym, and states even walking to his car leaves his legs tired and drained. Brian Cannon denies new onset chest pain, back pain, abdominal pain.  He has a normal appetite and normal bowel movements.  Medications have decreased his libido significantly, unable to be intimate with his girlfriend.  Unfortunately, Brian Cannon was recently let go from his job due to activity restrictions from his TBAD.  He has recently filed for disability.  The pt is  on a statin for cholesterol management.  The pt is  on a daily aspirin.   Other AC:  - The pt is  on medication for hypertension.   The pt is not diabetic.  Tobacco hx:  -  Past Medical History:  Diagnosis Date   Back pain    Hypertension     No past surgical history on file.  Social History   Socioeconomic History   Marital status: Single    Spouse name: Not on file   Number of children: Not on file   Years of education: Not on file   Highest education level: Not on file  Occupational History   Not on file  Tobacco Use   Smoking status: Former    Packs/day: 1.00    Types: Cigarettes    Passive exposure: Past   Smokeless tobacco: Never  Vaping Use   Vaping Use: Never used  Substance and Sexual Activity   Alcohol  use: Yes    Alcohol/week: 1.0 standard drink of alcohol    Types: 1 Cans of beer per week   Drug use: No   Sexual activity: Yes  Other Topics Concern   Not on file  Social History Narrative   Not on file   Social Determinants of Health   Financial Resource Strain: Not on file  Food Insecurity: Not on file  Transportation Needs: Not on file  Physical Activity: Not on file  Stress: Not on file  Social Connections: Not on file  Intimate Partner Violence: Not on file   Family History  Problem Relation Age of Onset   Heart attack Mother    Prostate cancer Father     Current Outpatient Medications  Medication Sig Dispense Refill   acetaminophen (TYLENOL) 325 MG tablet Take 650 mg by mouth every 6 (six) hours as needed for mild pain.     amLODipine (NORVASC) 10 MG tablet Take 1 tablet (10 mg total) by mouth daily. 90 tablet 3   atorvastatin (LIPITOR) 20 MG tablet Take 1 tablet (20 mg total) by mouth daily. 90 tablet 3   carvedilol (COREG) 25 MG tablet Take 1 tablet (25 mg total) by mouth 2 (two) times daily with a meal. 180 tablet 1   hydrochlorothiazide (HYDRODIURIL) 12.5 MG tablet  Take 1 tablet (12.5 mg total) by mouth daily. 90 tablet 3   HYDROcodone-acetaminophen (NORCO/VICODIN) 5-325 MG tablet Take 1 tablet by mouth every 4 (four) hours as needed for severe pain. 10 tablet 0   ibuprofen (ADVIL) 800 MG tablet Take 800 mg by mouth every 8 (eight) hours as needed for headache or mild pain.     lisinopril (ZESTRIL) 40 MG tablet Take 1 tablet (40 mg total) by mouth daily. 90 tablet 3   Multiple Vitamin (MULTIVITAMIN WITH MINERALS) TABS tablet Take 1 tablet by mouth daily.     pantoprazole (PROTONIX) 40 MG tablet Take 1 tablet (40 mg total) by mouth daily. 30 tablet 1   polyethylene glycol powder (GLYCOLAX/MIRALAX) 17 GM/SCOOP powder Take 17 g by mouth daily as needed for mild constipation. 238 g 0   potassium chloride SA (KLOR-CON M) 20 MEQ tablet Take 1 tablet (20 mEq total) by mouth  daily for 4 days. 4 tablet 0   simethicone (MYLICON) 80 MG chewable tablet Chew 2 tablets (160 mg total) by mouth 4 (four) times daily for 3 days. 30 tablet 0   No current facility-administered medications for this visit.    No Known Allergies   REVIEW OF SYSTEMS:  [X]  denotes positive finding, [ ]  denotes negative finding Cardiac  Comments:  Chest pain or chest pressure:    Shortness of breath upon exertion:    Short of breath when lying flat:    Irregular heart rhythm:        Vascular    Pain in calf, thigh, or hip brought on by ambulation: X   Pain in feet at night that wakes you up from your sleep:     Blood clot in your veins:    Leg swelling:         Pulmonary    Oxygen at home:    Productive cough:     Wheezing:         Neurologic    Sudden weakness in arms or legs:     Sudden numbness in arms or legs:     Sudden onset of difficulty speaking or slurred speech:    Temporary loss of vision in one eye:     Problems with dizziness:         Gastrointestinal    Blood in stool:     Vomited blood:         Genitourinary    Burning when urinating:     Blood in urine:        Psychiatric    Major depression:         Hematologic    Bleeding problems:    Problems with blood clotting too easily:        Skin    Rashes or ulcers:        Constitutional    Fever or chills:      PHYSICAL EXAMINATION:  There were no vitals filed for this visit.  General:  WDWN in NAD; vital signs documented above Gait: Not observed HENT: WNL, normocephalic Pulmonary: normal non-labored breathing , without wheezing Cardiac: regular HR Abdomen: soft, NT, no masses Skin: without rashes Vascular Exam/Pulses:  Right Left  Radial 2+ (normal) 2+ (normal)  Ulnar 2+ (normal) 2+ (normal)  Femoral 1+ (weak) 1+ (weak)  Popliteal    DP absent absent  PT absent absent   Extremities: without ischemic changes, without Gangrene , without cellulitis; without open wounds;   Musculoskeletal: no muscle wasting or atrophy  Neurologic: A&O X 3;  No focal weakness or paresthesias are detected Psychiatric:  The pt has Normal affect.   Non-Invasive Vascular Imaging:     Interval imaging of the chest abdomen pelvis demonstrates no aneurysmal dilatation of the aorta, however there is significant narrowing in the infrarenal segment from the false lumen.  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  Both renal arteries are patent with the left renal artery being perfused off of the false lumen.    ASSESSMENT/PLAN: Brian Cannon is a 61 y.o. 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  Brian Cannon would benefit from subclavian transposition, thoracic endovascular aortic repair in an effort to seal the proximal fenestration, decrease false lumen filling distally, and hopefully prevent future aneurysmal degeneration.  I am worried that if we wait until the chronic phase, the septum may become less pliable. I discussed this is several of my partners who agree that repair is in Brian Cannon's best interest.  I had a long discussion with  Brian Cannon and his family regarding the above we discussed complications including death, paralysis, renal failure, swallowing difficulties, damage to adjacent structures, hoarseness, infection, need for further surgery.  After discussing the risk and benefits, he elected to proceed.     Victorino Sparrow, MD [facility] [phone]

## 2021-09-11 ENCOUNTER — Ambulatory Visit (INDEPENDENT_AMBULATORY_CARE_PROVIDER_SITE_OTHER): Payer: 59 | Admitting: Vascular Surgery

## 2021-09-11 ENCOUNTER — Encounter: Payer: Self-pay | Admitting: Vascular Surgery

## 2021-09-11 VITALS — BP 101/70 | HR 69 | Temp 97.6°F | Resp 16 | Ht 74.0 in | Wt 170.0 lb

## 2021-09-11 DIAGNOSIS — I71019 Dissection of thoracic aorta, unspecified: Secondary | ICD-10-CM | POA: Diagnosis not present

## 2021-09-14 ENCOUNTER — Other Ambulatory Visit: Payer: Self-pay

## 2021-09-14 DIAGNOSIS — I71019 Dissection of thoracic aorta, unspecified: Secondary | ICD-10-CM

## 2021-09-25 NOTE — Progress Notes (Signed)
Surgical Instructions    Your procedure is scheduled on Thursday, July 20th, 2023.   Report to [facility] Main Entrance "A" at 05:30 A.M., then check in with the Admitting office.  Call this number if you have problems the morning of surgery:  [phone]   If you have any questions prior to your surgery date call [phone]: Open Monday-Friday 8am-4pm    Remember:  Do not eat or drink after midnight the night before your surgery    Take these medicines the morning of surgery with A SIP OF WATER:   amLODipine (NORVASC) atorvastatin (LIPITOR)  carvedilol (COREG)  acetaminophen (TYLENOL) - as needed  As of today, STOP taking any Aspirin (unless otherwise instructed by your surgeon) Aleve, Naproxen, Ibuprofen, Motrin, Advil, Goody's, BC's, all herbal medications, fish oil, and all vitamins.    The day of surgery:          Do not wear jewelry  Do not wear lotions, powders, colognes, or deodorant. Men may shave face and neck. Do not bring valuables to the hospital.   [facility] is not responsible for any belongings or valuables. .   Do NOT Smoke (Tobacco/Vaping)  24 hours prior to your procedure  If you use a CPAP at night, you may bring your mask for your overnight stay.   Contacts, glasses, hearing aids, dentures or partials may not be worn into surgery, please bring cases for these belongings   For patients admitted to the hospital, discharge time will be determined by your treatment team.   Patients discharged the day of surgery will not be allowed to drive home, and someone needs to stay with them for 24 hours.   SURGICAL WAITING ROOM VISITATION Patients having surgery or a procedure may have no more than 2 support people in the waiting area - these visitors may rotate.   Children under the age of 36 must have an adult with them who is not the patient. If the patient needs to stay at the hospital during part of their recovery, the visitor guidelines for inpatient  rooms apply. Pre-op nurse will coordinate an appropriate time for 1 support person to accompany patient in pre-op.  This support person may not rotate.   Please refer to the [facility] website for the visitor guidelines for Inpatients (after your surgery is over and you are in a regular room).    Special instructions:    Oral Hygiene is also important to reduce your risk of infection.  Remember - BRUSH YOUR TEETH THE MORNING OF SURGERY WITH YOUR REGULAR TOOTHPASTE   [place]- Preparing For Surgery  Before surgery, you can play an important role. Because skin is not sterile, your skin needs to be as free of germs as possible. You can reduce the number of germs on your skin by washing with CHG (chlorahexidine gluconate) Soap before surgery.  CHG is an antiseptic cleaner which kills germs and bonds with the skin to continue killing germs even after washing.     Please do not use if you have an allergy to CHG or antibacterial soaps. If your skin becomes reddened/irritated stop using the CHG.  Do not shave (including legs and underarms) for at least 48 hours prior to first CHG shower. It is OK to shave your face.  Please follow these instructions carefully.     Shower the NIGHT BEFORE SURGERY and the MORNING OF SURGERY with CHG Soap.   If you chose to wash your hair, wash your hair first  as usual with your normal shampoo. After you shampoo, rinse your hair and body thoroughly to remove the shampoo.  Then [employer] and genitals (private parts) with your normal soap and rinse thoroughly to remove soap.  After that Use CHG Soap as you would any other liquid soap. You can apply CHG directly to the skin and wash gently with a scrungie or a clean washcloth.   Apply the CHG Soap to your body ONLY FROM THE NECK DOWN.  Do not use on open wounds or open sores. Avoid contact with your eyes, ears, mouth and genitals (private parts). Wash Face and genitals (private parts)  with your normal soap.   Wash  thoroughly, paying special attention to the area where your surgery will be performed.  Thoroughly rinse your body with warm water from the neck down.  DO NOT shower/wash with your normal soap after using and rinsing off the CHG Soap.  Pat yourself dry with a CLEAN TOWEL.  Wear CLEAN PAJAMAS to bed the night before surgery  Place CLEAN SHEETS on your bed the night before your surgery  DO NOT SLEEP WITH PETS.   Day of Surgery:  Take a shower with CHG soap. Wear Clean/Comfortable clothing the morning of surgery Do not apply any deodorants/lotions.   Remember to brush your teeth WITH YOUR REGULAR TOOTHPASTE.    If you received a COVID test during your pre-op visit, it is requested that you wear a mask when out in public, stay away from anyone that may not be feeling well, and notify your surgeon if you develop symptoms. If you have been in contact with anyone that has tested positive in the last 10 days, please notify your surgeon.    Please read over the following fact sheets that you were given.

## 2021-09-28 ENCOUNTER — Encounter (HOSPITAL_COMMUNITY): Payer: Self-pay

## 2021-09-28 ENCOUNTER — Other Ambulatory Visit: Payer: Self-pay

## 2021-09-28 ENCOUNTER — Encounter (HOSPITAL_COMMUNITY)
Admission: RE | Admit: 2021-09-28 | Discharge: 2021-09-28 | Disposition: A | Discharge status: Home / Self Care | Payer: 59 | Source: Ambulatory Visit | Attending: Vascular Surgery | Admitting: Vascular Surgery

## 2021-09-28 VITALS — BP 103/72 | HR 76 | Temp 98.2°F | Resp 17 | Ht 73.0 in | Wt 172.1 lb

## 2021-09-28 DIAGNOSIS — Z20822 Contact with and (suspected) exposure to covid-19: Secondary | ICD-10-CM | POA: Diagnosis not present

## 2021-09-28 DIAGNOSIS — Z01812 Encounter for preprocedural laboratory examination: Secondary | ICD-10-CM | POA: Insufficient documentation

## 2021-09-28 DIAGNOSIS — Z01818 Encounter for other preprocedural examination: Secondary | ICD-10-CM

## 2021-09-28 DIAGNOSIS — I71019 Dissection of thoracic aorta, unspecified: Secondary | ICD-10-CM

## 2021-09-28 HISTORY — DX: Gastro-esophageal reflux disease without esophagitis: K21.9

## 2021-09-28 HISTORY — DX: Unspecified osteoarthritis, unspecified site: M19.90

## 2021-09-28 LAB — COMPREHENSIVE METABOLIC PANEL
ALT: 19 U/L (ref 0–44)
AST: 21 U/L (ref 15–41)
Albumin: 4.3 g/dL (ref 3.5–5.0)
Alkaline Phosphatase: 63 U/L (ref 38–126)
Anion gap: 10 (ref 5–15)
BUN: 18 mg/dL (ref 8–23)
CO2: 26 mmol/L (ref 22–32)
Calcium: 9.4 mg/dL (ref 8.9–10.3)
Chloride: 103 mmol/L (ref 98–111)
Creatinine, Ser: 1.08 mg/dL (ref 0.61–1.24)
GFR, Estimated: >60 mL/min (ref >60)
Glucose, Bld: 107 mg/dL — ABNORMAL HIGH (ref 70–99)
Potassium: 4.1 mmol/L (ref 3.5–5.1)
Sodium: 139 mmol/L (ref 135–145)
Total Bilirubin: 0.8 mg/dL (ref 0.3–1.2)
Total Protein: 8.1 g/dL (ref 6.5–8.1)

## 2021-09-28 LAB — URINALYSIS, ROUTINE W REFLEX MICROSCOPIC
Bilirubin Urine: NEGATIVE
Glucose, UA: NEGATIVE mg/dL
Hgb urine dipstick: NEGATIVE
Ketones, ur: NEGATIVE mg/dL
Leukocytes,Ua: NEGATIVE
Nitrite: NEGATIVE
Protein, ur: NEGATIVE mg/dL
Specific Gravity, Urine: 1.018 (ref 1.005–1.030)
pH: 5 (ref 5.0–8.0)

## 2021-09-28 LAB — TYPE AND SCREEN
ABO/RH(D): A POS
Antibody Screen: NEGATIVE

## 2021-09-28 LAB — PROTIME-INR
INR: 1 (ref 0.8–1.2)
Prothrombin Time: 13.5 seconds (ref 11.4–15.2)

## 2021-09-28 LAB — APTT: aPTT: 30 seconds (ref 24–36)

## 2021-09-28 LAB — SURGICAL PCR SCREEN
MRSA, PCR: NEGATIVE
Staphylococcus aureus: NEGATIVE

## 2021-09-28 LAB — CBC
HCT: 40.8 % (ref 39.0–52.0)
Hemoglobin: 14.3 g/dL (ref 13.0–17.0)
MCH: 33 pg (ref 26.0–34.0)
MCHC: 35 g/dL (ref 30.0–36.0)
MCV: 94.2 fL (ref 80.0–100.0)
Platelets: 236 10*3/uL (ref 150–400)
RBC: 4.33 MIL/uL (ref 4.22–5.81)
RDW: 12.8 % (ref 11.5–15.5)
WBC: 6.1 10*3/uL (ref 4.0–10.5)
nRBC: 0 % (ref 0.0–0.2)

## 2021-09-28 LAB — SARS CORONAVIRUS 2 (TAT 6-24 HRS): SARS Coronavirus 2: NEGATIVE

## 2021-09-28 NOTE — Progress Notes (Signed)
PCP - denies Cardiologist - Carolan Clines, MD  PPM/ICD - denies Device Orders - n/a Rep Notified - n/a  Chest x-ray - n/a EKG - 07/09/2021 Stress Test - denies ECHO - 06/07/2021 Cardiac Cath - denies  Sleep Study - denies CPAP - n/a  Fasting Blood Sugar - n/a  Blood Thinner Instructions: n/a  Aspirin Instructions: Patient was instructed: As of today, STOP taking any Aspirin (unless otherwise instructed by your surgeon) Aleve, Naproxen, Ibuprofen, Motrin, Advil, Goody's, BC's, all herbal medications, fish oil, and all vitamins.  ERAS Protcol - n/a  COVID TEST- done in PAT on 09/28/2021   Anesthesia review: no  Patient denies shortness of breath, fever, cough and chest pain at PAT appointment   All instructions explained to the patient, with a verbal understanding of the material. Patient agrees to go over the instructions while at home for a better understanding. Patient also instructed to self quarantine after being tested for COVID-19. The opportunity to ask questions was provided.

## 2021-09-30 NOTE — Anesthesia Preprocedure Evaluation (Addendum)
Anesthesia Evaluation  Patient identified by MRN, date of birth, ID band Patient awake    Reviewed: Allergy & Precautions, NPO status , Patient's Chart, lab work & pertinent test results  Airway Mallampati: II  TM Distance: >3 FB Neck ROM: Full    Dental no notable dental hx.    Pulmonary neg pulmonary ROS, former smoker,    Pulmonary exam normal        Cardiovascular hypertension, Pt. on medications and Pt. on home beta blockers + Peripheral Vascular Disease   Rhythm:Regular Rate:Normal     Neuro/Psych negative neurological ROS  negative psych ROS   GI/Hepatic Neg liver ROS, GERD  Medicated,  Endo/Other  negative endocrine ROS  Renal/GU negative Renal ROS  negative genitourinary   Musculoskeletal  (+) Arthritis , Osteoarthritis,    Abdominal Normal abdominal exam  (+)   Peds  Hematology negative hematology ROS (+)   Anesthesia Other Findings   Reproductive/Obstetrics                            Anesthesia Physical Anesthesia Plan  ASA: 3  Anesthesia Plan: General   Post-op Pain Management:    Induction: Intravenous  PONV Risk Score and Plan: 2 and Ondansetron, Dexamethasone, Midazolam and Treatment may vary due to age or medical condition  Airway Management Planned: Mask and Oral ETT  Additional Equipment: Arterial line  Intra-op Plan:   Post-operative Plan: Extubation in OR  Informed Consent: I have reviewed the patients History and Physical, chart, labs and discussed the procedure including the risks, benefits and alternatives for the proposed anesthesia with the patient or authorized representative who has indicated his/her understanding and acceptance.     Dental advisory given  Plan Discussed with: CRNA  Anesthesia Plan Comments: (Lab Results      Component                Value               Date                      WBC                      6.1                  09/28/2021                HGB                      14.3                09/28/2021                HCT                      40.8                09/28/2021                MCV                      94.2                09/28/2021                PLT  236                 09/28/2021           Lab Results      Component                Value               Date                      NA                       139                 09/28/2021                K                        4.1                 09/28/2021                CO2                      26                  09/28/2021                GLUCOSE                  107 (H)             09/28/2021                BUN                      18                  09/28/2021                CREATININE               1.08                09/28/2021                CALCIUM                  9.4                 09/28/2021                EGFR                     88                  07/01/2021                GFRNONAA                 >60                 09/28/2021            ECHO 03/23: 1. Left ventricular ejection fraction, by estimation, is 50 to 55%. The  left ventricle has low normal function. The left ventricle has no regional  wall motion abnormalities. There is mild left ventricular hypertrophy.  Left ventricular  diastolic  parameters were normal.  2. Right ventricular systolic function is normal. The right ventricular  size is mildly enlarged. There is normal pulmonary artery systolic  pressure.  3. The mitral valve is normal in structure. No evidence of mitral valve  regurgitation. No evidence of mitral stenosis.  4. The aortic valve is tricuspid. Aortic valve regurgitation is not  visualized. No aortic stenosis is present.  5. The inferior vena cava is normal in size with greater than 50%  respiratory variability, suggesting right atrial pressure of 3 mmHg.)       Anesthesia Quick Evaluation

## 2021-10-01 ENCOUNTER — Inpatient Hospital Stay (HOSPITAL_COMMUNITY): Payer: No Typology Code available for payment source

## 2021-10-01 ENCOUNTER — Inpatient Hospital Stay (HOSPITAL_COMMUNITY): Payer: No Typology Code available for payment source | Admitting: Emergency Medicine

## 2021-10-01 ENCOUNTER — Encounter (HOSPITAL_COMMUNITY)
Admission: RE | Disposition: A | Discharge status: Home / Self Care | Payer: Self-pay | Source: Home / Self Care | Attending: Vascular Surgery

## 2021-10-01 ENCOUNTER — Other Ambulatory Visit: Payer: Self-pay

## 2021-10-01 ENCOUNTER — Inpatient Hospital Stay (HOSPITAL_COMMUNITY): Payer: No Typology Code available for payment source | Admitting: Certified Registered"

## 2021-10-01 ENCOUNTER — Inpatient Hospital Stay (HOSPITAL_COMMUNITY)
Admission: RE | Admit: 2021-10-01 | Discharge: 2021-10-08 | DRG: 221 | Disposition: A | Discharge status: Home / Self Care | Payer: No Typology Code available for payment source | Attending: Vascular Surgery | Admitting: Vascular Surgery

## 2021-10-01 DIAGNOSIS — Z87891 Personal history of nicotine dependence: Secondary | ICD-10-CM | POA: Diagnosis not present

## 2021-10-01 DIAGNOSIS — M1990 Unspecified osteoarthritis, unspecified site: Secondary | ICD-10-CM | POA: Diagnosis not present

## 2021-10-01 DIAGNOSIS — Z79899 Other long term (current) drug therapy: Secondary | ICD-10-CM

## 2021-10-01 DIAGNOSIS — I959 Hypotension, unspecified: Secondary | ICD-10-CM | POA: Diagnosis not present

## 2021-10-01 DIAGNOSIS — Z8249 Family history of ischemic heart disease and other diseases of the circulatory system: Secondary | ICD-10-CM

## 2021-10-01 DIAGNOSIS — K219 Gastro-esophageal reflux disease without esophagitis: Secondary | ICD-10-CM | POA: Diagnosis present

## 2021-10-01 DIAGNOSIS — F419 Anxiety disorder, unspecified: Secondary | ICD-10-CM | POA: Diagnosis present

## 2021-10-01 DIAGNOSIS — I71019 Dissection of thoracic aorta, unspecified: Secondary | ICD-10-CM | POA: Diagnosis not present

## 2021-10-01 DIAGNOSIS — I7100 Dissection of unspecified site of aorta: Principal | ICD-10-CM | POA: Diagnosis present

## 2021-10-01 DIAGNOSIS — I71012 Dissection of descending thoracic aorta: Principal | ICD-10-CM

## 2021-10-01 DIAGNOSIS — I714 Abdominal aortic aneurysm, without rupture, unspecified: Secondary | ICD-10-CM | POA: Diagnosis present

## 2021-10-01 DIAGNOSIS — I10 Essential (primary) hypertension: Secondary | ICD-10-CM

## 2021-10-01 DIAGNOSIS — K5900 Constipation, unspecified: Secondary | ICD-10-CM | POA: Diagnosis not present

## 2021-10-01 DIAGNOSIS — I739 Peripheral vascular disease, unspecified: Secondary | ICD-10-CM | POA: Diagnosis present

## 2021-10-01 DIAGNOSIS — I7103 Dissection of thoracoabdominal aorta: Secondary | ICD-10-CM | POA: Diagnosis not present

## 2021-10-01 HISTORY — PX: CAROTID-SUBCLAVIAN BYPASS GRAFT: SHX910

## 2021-10-01 HISTORY — PX: ULTRASOUND GUIDANCE FOR VASCULAR ACCESS: SHX6516

## 2021-10-01 HISTORY — PX: THORACIC AORTIC ENDOVASCULAR STENT GRAFT: SHX6112

## 2021-10-01 LAB — CBC
HCT: 32.3 % — ABNORMAL LOW (ref 39.0–52.0)
Hemoglobin: 11.7 g/dL — ABNORMAL LOW (ref 13.0–17.0)
MCH: 33.5 pg (ref 26.0–34.0)
MCHC: 36.2 g/dL — ABNORMAL HIGH (ref 30.0–36.0)
MCV: 92.6 fL (ref 80.0–100.0)
Platelets: 151 10*3/uL (ref 150–400)
RBC: 3.49 MIL/uL — ABNORMAL LOW (ref 4.22–5.81)
RDW: 12.7 % (ref 11.5–15.5)
WBC: 9.3 10*3/uL (ref 4.0–10.5)
nRBC: 0 % (ref 0.0–0.2)

## 2021-10-01 LAB — PROTIME-INR
INR: 1.2 (ref 0.8–1.2)
Prothrombin Time: 15.1 seconds (ref 11.4–15.2)

## 2021-10-01 LAB — POCT ACTIVATED CLOTTING TIME
Activated Clotting Time: 191 seconds
Activated Clotting Time: 239 seconds
Activated Clotting Time: 251 seconds
Activated Clotting Time: 239 seconds

## 2021-10-01 LAB — BASIC METABOLIC PANEL
Anion gap: 7 (ref 5–15)
BUN: 20 mg/dL (ref 8–23)
CO2: 23 mmol/L (ref 22–32)
Calcium: 8.4 mg/dL — ABNORMAL LOW (ref 8.9–10.3)
Chloride: 107 mmol/L (ref 98–111)
Creatinine, Ser: 1.08 mg/dL (ref 0.61–1.24)
GFR, Estimated: >60 mL/min (ref >60)
Glucose, Bld: 139 mg/dL — ABNORMAL HIGH (ref 70–99)
Potassium: 3.7 mmol/L (ref 3.5–5.1)
Sodium: 137 mmol/L (ref 135–145)

## 2021-10-01 LAB — APTT: aPTT: 27 seconds (ref 24–36)

## 2021-10-01 LAB — ABO/RH: ABO/RH(D): A POS

## 2021-10-01 LAB — MAGNESIUM: Magnesium: 1.9 mg/dL (ref 1.7–2.4)

## 2021-10-01 SURGERY — CREATION, BYPASS, ARTERIAL, SUBCLAVIAN TO CAROTID, USING GRAFT
Anesthesia: General | Site: Groin | Laterality: Right

## 2021-10-01 MED ORDER — PANTOPRAZOLE SODIUM 40 MG PO TBEC
40.0000 mg | DELAYED_RELEASE_TABLET | Freq: Every day | ORAL | Status: DC
  Administered 2021-10-01 – 2021-10-08 (×8): 40 mg via ORAL
  Filled 2021-10-01 (×8): qty 1

## 2021-10-01 MED ORDER — ATORVASTATIN CALCIUM 10 MG PO TABS
20.0000 mg | ORAL_TABLET | Freq: Every day | ORAL | Status: DC
  Administered 2021-10-01 – 2021-10-08 (×8): 20 mg via ORAL
  Filled 2021-10-01 (×8): qty 2

## 2021-10-01 MED ORDER — FENTANYL CITRATE (PF) 100 MCG/2ML IJ SOLN
INTRAMUSCULAR | Status: AC
  Filled 2021-10-01: qty 2

## 2021-10-01 MED ORDER — ALUM & MAG HYDROXIDE-SIMETH 200-200-20 MG/5ML PO SUSP
15.0000 mL | ORAL | Status: DC | PRN
  Administered 2021-10-03 – 2021-10-06 (×4): 30 mL via ORAL
  Filled 2021-10-01 (×4): qty 30

## 2021-10-01 MED ORDER — PHENOL 1.4 % MT LIQD: 1.0000 | OROMUCOSAL | Status: DC | PRN

## 2021-10-01 MED ORDER — DEXAMETHASONE SODIUM PHOSPHATE 10 MG/ML IJ SOLN
INTRAMUSCULAR | Status: DC | PRN
  Administered 2021-10-01: 10 mg via INTRAVENOUS

## 2021-10-01 MED ORDER — MAGNESIUM SULFATE 2 GM/50ML IV SOLN: 2.0000 g | Freq: Every day | INTRAVENOUS | Status: DC | PRN

## 2021-10-01 MED ORDER — GUAIFENESIN-DM 100-10 MG/5ML PO SYRP
15.0000 mL | ORAL_SOLUTION | ORAL | Status: DC | PRN
  Administered 2021-10-03: 15 mL via ORAL
  Filled 2021-10-01 (×2): qty 15

## 2021-10-01 MED ORDER — SUCCINYLCHOLINE CHLORIDE 200 MG/10ML IV SOSY
PREFILLED_SYRINGE | INTRAVENOUS | Status: DC | PRN
  Administered 2021-10-01: 120 mg via INTRAVENOUS

## 2021-10-01 MED ORDER — LACTATED RINGERS IV SOLN: INTRAVENOUS | Status: DC | PRN

## 2021-10-01 MED ORDER — AMLODIPINE BESYLATE 10 MG PO TABS
10.0000 mg | ORAL_TABLET | Freq: Every day | ORAL | Status: DC
Start: 2021-10-01 — End: 2021-10-06
  Administered 2021-10-01 – 2021-10-05 (×5): 10 mg via ORAL
  Filled 2021-10-01 (×6): qty 1

## 2021-10-01 MED ORDER — CHLORHEXIDINE GLUCONATE 0.12 % MT SOLN: 15.0000 mL | Freq: Once | OROMUCOSAL | Status: AC

## 2021-10-01 MED ORDER — CHLORHEXIDINE GLUCONATE 0.12 % MT SOLN
OROMUCOSAL | Status: AC
  Administered 2021-10-01: 15 mL via OROMUCOSAL
  Filled 2021-10-01: qty 15

## 2021-10-01 MED ORDER — PHENYLEPHRINE 80 MCG/ML (10ML) SYRINGE FOR IV PUSH (FOR BLOOD PRESSURE SUPPORT)
PREFILLED_SYRINGE | INTRAVENOUS | Status: DC | PRN
  Administered 2021-10-01: 160 ug via INTRAVENOUS
  Administered 2021-10-01: 80 ug via INTRAVENOUS
  Administered 2021-10-01 (×4): 160 ug via INTRAVENOUS

## 2021-10-01 MED ORDER — OXYCODONE-ACETAMINOPHEN 5-325 MG PO TABS
1.0000 | ORAL_TABLET | ORAL | Status: DC | PRN
  Administered 2021-10-01 – 2021-10-06 (×10): 2 via ORAL
  Filled 2021-10-01 (×11): qty 2

## 2021-10-01 MED ORDER — HEPARIN 6000 UNIT IRRIGATION SOLUTION
Status: AC
  Filled 2021-10-01: qty 500

## 2021-10-01 MED ORDER — HYDRALAZINE HCL 20 MG/ML IJ SOLN: 5.0000 mg | INTRAMUSCULAR | Status: DC | PRN

## 2021-10-01 MED ORDER — ACETAMINOPHEN 10 MG/ML IV SOLN: 1000.0000 mg | Freq: Once | INTRAVENOUS | Status: DC | PRN

## 2021-10-01 MED ORDER — SODIUM CHLORIDE 0.9 % IV SOLN: INTRAVENOUS | Status: DC

## 2021-10-01 MED ORDER — CARVEDILOL 12.5 MG PO TABS
ORAL_TABLET | ORAL | Status: AC
  Filled 2021-10-01: qty 2

## 2021-10-01 MED ORDER — HEPARIN SODIUM (PORCINE) 1000 UNIT/ML IJ SOLN
INTRAMUSCULAR | Status: DC | PRN
  Administered 2021-10-01 (×2): 2000 [IU] via INTRAVENOUS
  Administered 2021-10-01: 8000 [IU] via INTRAVENOUS
  Administered 2021-10-01: 4000 [IU] via INTRAVENOUS

## 2021-10-01 MED ORDER — HEMOSTATIC AGENTS (NO CHARGE) OPTIME
TOPICAL | Status: DC | PRN
  Administered 2021-10-01: 1 via TOPICAL

## 2021-10-01 MED ORDER — EPHEDRINE SULFATE-NACL 50-0.9 MG/10ML-% IV SOSY
PREFILLED_SYRINGE | INTRAVENOUS | Status: DC | PRN
  Administered 2021-10-01: 10 mg via INTRAVENOUS
  Administered 2021-10-01 (×4): 5 mg via INTRAVENOUS

## 2021-10-01 MED ORDER — ORAL CARE MOUTH RINSE: 15.0000 mL | Freq: Once | OROMUCOSAL | Status: AC

## 2021-10-01 MED ORDER — HEPARIN 6000 UNIT IRRIGATION SOLUTION
Status: DC | PRN
  Administered 2021-10-01: 2

## 2021-10-01 MED ORDER — POTASSIUM CHLORIDE CRYS ER 20 MEQ PO TBCR
20.0000 meq | EXTENDED_RELEASE_TABLET | Freq: Every day | ORAL | Status: AC | PRN
  Administered 2021-10-01: 20 meq via ORAL
  Filled 2021-10-01: qty 1

## 2021-10-01 MED ORDER — MIDAZOLAM HCL 2 MG/2ML IJ SOLN
INTRAMUSCULAR | Status: AC
  Filled 2021-10-01: qty 2

## 2021-10-01 MED ORDER — SODIUM CHLORIDE 0.9 % IV SOLN: INTRAVENOUS | Status: AC

## 2021-10-01 MED ORDER — ACETAMINOPHEN 325 MG PO TABS
325.0000 mg | ORAL_TABLET | ORAL | Status: DC | PRN
  Administered 2021-10-02 – 2021-10-07 (×8): 650 mg via ORAL
  Filled 2021-10-01 (×8): qty 2

## 2021-10-01 MED ORDER — CARVEDILOL 25 MG PO TABS
25.0000 mg | ORAL_TABLET | Freq: Once | ORAL | Status: AC
Start: 2021-10-01 — End: 2021-10-01
  Administered 2021-10-01: 25 mg via ORAL
  Filled 2021-10-01: qty 1

## 2021-10-01 MED ORDER — PROPOFOL 10 MG/ML IV BOLUS
INTRAVENOUS | Status: DC | PRN
  Administered 2021-10-01: 160 mg via INTRAVENOUS

## 2021-10-01 MED ORDER — SODIUM CHLORIDE 0.9 % IV SOLN: INTRAVENOUS | Status: DC | PRN

## 2021-10-01 MED ORDER — CHLORHEXIDINE GLUCONATE CLOTH 2 % EX PADS: 6.0000 | MEDICATED_PAD | Freq: Once | CUTANEOUS | Status: DC

## 2021-10-01 MED ORDER — 0.9 % SODIUM CHLORIDE (POUR BTL) OPTIME
TOPICAL | Status: DC | PRN
  Administered 2021-10-01 (×2): 1000 mL

## 2021-10-01 MED ORDER — LABETALOL HCL 5 MG/ML IV SOLN
10.0000 mg | INTRAVENOUS | Status: DC | PRN
  Administered 2021-10-01: 10 mg via INTRAVENOUS
  Filled 2021-10-01: qty 4

## 2021-10-01 MED ORDER — PHENYLEPHRINE HCL-NACL 20-0.9 MG/250ML-% IV SOLN
INTRAVENOUS | Status: DC | PRN
  Administered 2021-10-01: 30 ug/min via INTRAVENOUS

## 2021-10-01 MED ORDER — ONDANSETRON HCL 4 MG/2ML IJ SOLN
4.0000 mg | Freq: Four times a day (QID) | INTRAMUSCULAR | Status: DC | PRN
  Administered 2021-10-03 – 2021-10-04 (×3): 4 mg via INTRAVENOUS
  Filled 2021-10-01 (×3): qty 2

## 2021-10-01 MED ORDER — MORPHINE SULFATE (PF) 2 MG/ML IV SOLN
2.0000 mg | INTRAVENOUS | Status: DC | PRN
  Administered 2021-10-01 – 2021-10-02 (×5): 2 mg via INTRAVENOUS
  Filled 2021-10-01 (×5): qty 1

## 2021-10-01 MED ORDER — PROTAMINE SULFATE 10 MG/ML IV SOLN
INTRAVENOUS | Status: DC | PRN
  Administered 2021-10-01: 40 mg via INTRAVENOUS
  Administered 2021-10-01: 10 mg via INTRAVENOUS

## 2021-10-01 MED ORDER — MIDAZOLAM HCL 2 MG/2ML IJ SOLN
INTRAMUSCULAR | Status: DC | PRN
  Administered 2021-10-01 (×2): 1 mg via INTRAVENOUS

## 2021-10-01 MED ORDER — FENTANYL CITRATE (PF) 250 MCG/5ML IJ SOLN
INTRAMUSCULAR | Status: AC
  Filled 2021-10-01: qty 5

## 2021-10-01 MED ORDER — LACTATED RINGERS IV SOLN
INTRAVENOUS | Status: DC
Start: 2021-10-01 — End: 2021-10-01

## 2021-10-01 MED ORDER — CEFAZOLIN SODIUM-DEXTROSE 2-4 GM/100ML-% IV SOLN
INTRAVENOUS | Status: AC
  Filled 2021-10-01: qty 100

## 2021-10-01 MED ORDER — CHLORHEXIDINE GLUCONATE CLOTH 2 % EX PADS
6.0000 | MEDICATED_PAD | Freq: Every day | CUTANEOUS | Status: DC
Start: 2021-10-02 — End: 2021-10-08
  Administered 2021-10-02 – 2021-10-08 (×7): 6 via TOPICAL

## 2021-10-01 MED ORDER — HYDROCHLOROTHIAZIDE 12.5 MG PO TABS
12.5000 mg | ORAL_TABLET | Freq: Every day | ORAL | Status: DC
  Administered 2021-10-01 – 2021-10-08 (×8): 12.5 mg via ORAL
  Filled 2021-10-01 (×8): qty 1

## 2021-10-01 MED ORDER — ORAL CARE MOUTH RINSE: 15.0000 mL | OROMUCOSAL | Status: DC | PRN

## 2021-10-01 MED ORDER — ASPIRIN 81 MG PO TBEC
81.0000 mg | DELAYED_RELEASE_TABLET | Freq: Every day | ORAL | Status: DC
  Administered 2021-10-02 – 2021-10-08 (×7): 81 mg via ORAL
  Filled 2021-10-01 (×8): qty 1

## 2021-10-01 MED ORDER — FENTANYL CITRATE (PF) 250 MCG/5ML IJ SOLN
INTRAMUSCULAR | Status: DC | PRN
  Administered 2021-10-01: 100 ug via INTRAVENOUS

## 2021-10-01 MED ORDER — METOPROLOL TARTRATE 5 MG/5ML IV SOLN: 2.0000 mg | INTRAVENOUS | Status: DC | PRN

## 2021-10-01 MED ORDER — LISINOPRIL 20 MG PO TABS
40.0000 mg | ORAL_TABLET | Freq: Every day | ORAL | Status: DC
  Administered 2021-10-03 – 2021-10-08 (×5): 40 mg via ORAL
  Filled 2021-10-01 (×5): qty 2

## 2021-10-01 MED ORDER — ONDANSETRON HCL 4 MG/2ML IJ SOLN
INTRAMUSCULAR | Status: DC | PRN
  Administered 2021-10-01: 4 mg via INTRAVENOUS

## 2021-10-01 MED ORDER — DOCUSATE SODIUM 100 MG PO CAPS
100.0000 mg | ORAL_CAPSULE | Freq: Every day | ORAL | Status: DC
  Administered 2021-10-02 – 2021-10-08 (×7): 100 mg via ORAL
  Filled 2021-10-01 (×7): qty 1

## 2021-10-01 MED ORDER — ACETAMINOPHEN 325 MG RE SUPP: 325.0000 mg | RECTAL | Status: DC | PRN

## 2021-10-01 MED ORDER — LIP MEDEX EX OINT: TOPICAL_OINTMENT | CUTANEOUS | Status: DC | PRN

## 2021-10-01 MED ORDER — CARVEDILOL 25 MG PO TABS
25.0000 mg | ORAL_TABLET | Freq: Two times a day (BID) | ORAL | Status: DC
  Administered 2021-10-01 – 2021-10-04 (×6): 25 mg via ORAL
  Filled 2021-10-01 (×7): qty 1

## 2021-10-01 MED ORDER — SUGAMMADEX SODIUM 200 MG/2ML IV SOLN
INTRAVENOUS | Status: DC | PRN
  Administered 2021-10-01: 200 mg via INTRAVENOUS

## 2021-10-01 MED ORDER — FENTANYL CITRATE (PF) 100 MCG/2ML IJ SOLN
25.0000 ug | INTRAMUSCULAR | Status: DC | PRN
  Administered 2021-10-01 (×2): 50 ug via INTRAVENOUS

## 2021-10-01 MED ORDER — LIDOCAINE 2% (20 MG/ML) 5 ML SYRINGE
INTRAMUSCULAR | Status: DC | PRN
  Administered 2021-10-01: 60 mg via INTRAVENOUS

## 2021-10-01 MED ORDER — ROCURONIUM BROMIDE 10 MG/ML (PF) SYRINGE
PREFILLED_SYRINGE | INTRAVENOUS | Status: DC | PRN
  Administered 2021-10-01 (×2): 30 mg via INTRAVENOUS
  Administered 2021-10-01: 50 mg via INTRAVENOUS
  Administered 2021-10-01: 30 mg via INTRAVENOUS

## 2021-10-01 MED ORDER — PROPOFOL 10 MG/ML IV BOLUS
INTRAVENOUS | Status: AC
  Filled 2021-10-01: qty 20

## 2021-10-01 MED ORDER — IODIXANOL 320 MG/ML IV SOLN
INTRAVENOUS | Status: DC | PRN
  Administered 2021-10-01: 180 mL

## 2021-10-01 MED ORDER — SODIUM CHLORIDE 0.9 % IV SOLN
500.0000 mL | Freq: Once | INTRAVENOUS | Status: AC | PRN
  Administered 2021-10-03: 500 mL via INTRAVENOUS

## 2021-10-01 MED ORDER — CEFAZOLIN SODIUM-DEXTROSE 2-4 GM/100ML-% IV SOLN
2.0000 g | INTRAVENOUS | Status: AC
  Administered 2021-10-01: 2 g via INTRAVENOUS

## 2021-10-01 MED ORDER — CEFAZOLIN SODIUM-DEXTROSE 2-4 GM/100ML-% IV SOLN
2.0000 g | Freq: Three times a day (TID) | INTRAVENOUS | Status: AC
  Administered 2021-10-01 – 2021-10-02 (×2): 2 g via INTRAVENOUS
  Filled 2021-10-01 (×2): qty 100

## 2021-10-01 SURGICAL SUPPLY — 73 items
BAG COUNTER SPONGE SURGICOUNT (BAG) ×4 IMPLANT
BAG SNAP BAND KOVER 36X36 (MISCELLANEOUS) ×1 IMPLANT
CANISTER SUCT 3000ML PPV (MISCELLANEOUS) ×4 IMPLANT
CATH ACCU-VU SIZ PIG 5F 100CM (CATHETERS) ×2 IMPLANT
CATH ANGIO 5F BER2 100CM (CATHETERS) ×1 IMPLANT
CATH VISIONS PV .035 IVUS (CATHETERS) ×1 IMPLANT
CLIP LIGATING EXTRA MED SLVR (CLIP) ×4 IMPLANT
CLIP LIGATING EXTRA SM BLUE (MISCELLANEOUS) ×8 IMPLANT
CLIP TI LARGE 6 (CLIP) ×1 IMPLANT
COVER BACK TABLE 80X110 HD (DRAPES) ×1 IMPLANT
COVER DOME SNAP 22 D (MISCELLANEOUS) ×1 IMPLANT
COVER PROBE W GEL 5X96 (DRAPES) ×5 IMPLANT
DERMABOND ADVANCED (GAUZE/BANDAGES/DRESSINGS) ×2
DERMABOND ADVANCED .7 DNX12 (GAUZE/BANDAGES/DRESSINGS) ×3 IMPLANT
DEVICE CLOSURE PERCLS PRGLD 6F (VASCULAR PRODUCTS) IMPLANT
DRAIN CHANNEL 15F RND FF W/TCR (WOUND CARE) IMPLANT
DRAIN CHANNEL 19F RND (DRAIN) IMPLANT
DRAIN JACKSON PRT FLT 10 (DRAIN) ×1 IMPLANT
DRAPE CV SPLIT W-CLR ANES SCRN (DRAPES) ×1 IMPLANT
DRAPE INCISE IOBAN 66X45 STRL (DRAPES) ×1 IMPLANT
DRAPE PERI GROIN 82X75IN TIB (DRAPES) ×1 IMPLANT
DRSG TEGADERM 2-3/8X2-3/4 SM (GAUZE/BANDAGES/DRESSINGS) ×4 IMPLANT
DRYSEAL FLEXSHEATH 22FR 33CM (SHEATH) ×1
ELECT REM PT RETURN 9FT ADLT (ELECTROSURGICAL) ×4
ELECTRODE REM PT RTRN 9FT ADLT (ELECTROSURGICAL) ×6 IMPLANT
EVACUATOR SILICONE 100CC (DRAIN) ×1 IMPLANT
GLIDEWIRE ADV .035X260CM (WIRE) ×1 IMPLANT
GLOVE BIO SURGEON STRL SZ7.5 (GLOVE) ×4 IMPLANT
GLOVE BIOGEL PI IND STRL 8 (GLOVE) ×3 IMPLANT
GLOVE BIOGEL PI INDICATOR 8 (GLOVE) ×1
GLOVE SRG 8 PF TXTR STRL LF DI (GLOVE) ×3 IMPLANT
GLOVE SURG POLYISO LF SZ8 (GLOVE) IMPLANT
GLOVE SURG UNDER POLY LF SZ8 (GLOVE) ×4
GOWN STRL REUS W/ TWL LRG LVL3 (GOWN DISPOSABLE) ×9 IMPLANT
GOWN STRL REUS W/TWL 2XL LVL3 (GOWN DISPOSABLE) ×8 IMPLANT
GOWN STRL REUS W/TWL LRG LVL3 (GOWN DISPOSABLE) ×12
HEMOSTAT SNOW SURGICEL 2X4 (HEMOSTASIS) ×1 IMPLANT
INSERT FOGARTY SM (MISCELLANEOUS) ×7 IMPLANT
KIT BASIN OR (CUSTOM PROCEDURE TRAY) ×4 IMPLANT
KIT NAMIC PS PRESSURIZED FLUID (KITS) ×1 IMPLANT
KIT TURNOVER KIT B (KITS) ×4 IMPLANT
NS IRRIG 1000ML POUR BTL (IV SOLUTION) ×8 IMPLANT
PACK CAROTID (CUSTOM PROCEDURE TRAY) ×4 IMPLANT
PAD ARMBOARD 7.5X6 YLW CONV (MISCELLANEOUS) ×8 IMPLANT
PERCLOSE PROGLIDE 6F (VASCULAR PRODUCTS) ×12
POSITIONER HEAD DONUT 9IN (MISCELLANEOUS) ×4 IMPLANT
PUNCH AORTIC ROTATE 5MM 8IN (MISCELLANEOUS) ×1 IMPLANT
SET MICROPUNCTURE 5F STIFF (MISCELLANEOUS) ×1 IMPLANT
SHEATH AVANTI 11CM 8FR (SHEATH) ×1 IMPLANT
SHEATH DRYSEAL FLEX 22FR 33CM (SHEATH) IMPLANT
STENT ENDOVASC Z-TRAK 46X120 (Permanent Stent) ×1 IMPLANT
STENT THORAC ACS 34X34X20 (Endovascular Graft) ×1 IMPLANT
STOPCOCK MORSE 400PSI 3WAY (MISCELLANEOUS) ×4 IMPLANT
SUT ETHILON 3 0 PS 1 (SUTURE) ×1 IMPLANT
SUT MNCRL AB 4-0 PS2 18 (SUTURE) ×8 IMPLANT
SUT MON AB-0 CT1 36 (SUTURE) ×1 IMPLANT
SUT PROLENE 5 0 C 1 24 (SUTURE) ×3 IMPLANT
SUT PROLENE 6 0 BV (SUTURE) ×4 IMPLANT
SUT PROLENE 6 0 CC (SUTURE) ×4 IMPLANT
SUT SILK 3 0 (SUTURE) ×4
SUT SILK 3-0 18XBRD TIE 12 (SUTURE) IMPLANT
SUT VIC AB 2-0 CT1 27 (SUTURE) ×4
SUT VIC AB 2-0 CT1 TAPERPNT 27 (SUTURE) ×3 IMPLANT
SUT VIC AB 3-0 SH 27 (SUTURE) ×4
SUT VIC AB 3-0 SH 27XBRD (SUTURE) ×3 IMPLANT
SYR 30ML LL (SYRINGE) IMPLANT
SYR 50ML LL SCALE MARK (SYRINGE) ×4 IMPLANT
TOWEL GREEN STERILE (TOWEL DISPOSABLE) ×4 IMPLANT
TRAY FOLEY MTR SLVR 16FR STAT (SET/KITS/TRAYS/PACK) ×4 IMPLANT
TUBING HIGH PRESSURE 120CM (CONNECTOR) ×4 IMPLANT
WATER STERILE IRR 1000ML POUR (IV SOLUTION) ×4 IMPLANT
WIRE BENTSON .035X145CM (WIRE) ×1 IMPLANT
WIRE STIFF LUNDERQUIST 260CM (WIRE) ×1 IMPLANT

## 2021-10-01 NOTE — H&P (Signed)
Office Note   Patient seen and examined in preop holding.  No complaints. No changes to medication history or physical exam since last seen in clinic. After discussing the risks and benefits of carotid transposition v carotid subclavian bypass with TEVAR, Brian Cannon elected to proceed.   Brian Sparrow MD   HPI: Brian Cannon is a 61 y.o. 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  I asked him to return to clinic after I could discuss the case my partners in order to provide him with the best treatment option moving forward.  On exam today, Brian Cannon was doing well.  He was accompanied by one of his daughters.  Continues to have anxiety and blood pressure control issues.  Last week, he was noted to be hypertensive and restarted some of his home medications.  Brian Cannon has been unable to return to the gym, and states even walking to his car leaves his legs tired and drained. Brian Cannon denies new onset chest pain, back pain, abdominal pain.  He has a normal appetite and normal bowel movements.  Medications have decreased his libido significantly, unable to be intimate with his girlfriend.  Unfortunately, Brian Cannon was recently let go from his job due to activity restrictions from his TBAD.  He has recently filed for disability.  The pt is  on a statin for cholesterol management.  The pt is  on a daily aspirin.   Other AC:  - The pt is  on medication for hypertension.   The pt is not diabetic.  Tobacco hx:  -  Past Medical History:  Diagnosis Date   Arthritis    Back pain    GERD (gastroesophageal reflux disease)    Hypertension     No past surgical history on file.  Social History   Socioeconomic History   Marital status: Single    Spouse name: Not on file    Number of children: Not on file   Years of education: Not on file   Highest education level: Not on file  Occupational History   Not on file  Tobacco Use   Smoking status: Former    Packs/day: 1.00    Types: Cigarettes    Passive exposure: Past   Smokeless tobacco: Never  Vaping Use   Vaping Use: Never used  Substance and Sexual Activity   Alcohol use: Yes    Alcohol/week: 1.0 standard drink of alcohol    Types: 1 Cans of beer per week   Drug use: No   Sexual activity: Yes  Other Topics Concern   Not on file  Social History Narrative   Not on file   Social Determinants of Health   Financial Resource Strain: Not on file  Food Insecurity: Not on file  Transportation Needs: Not on file  Physical Activity: Not on file  Stress: Not on file  Social Connections: Not on file  Intimate Partner Violence: Not on file   Family History  Problem Relation Age of Onset   Heart attack Mother    Prostate cancer Father     Current Facility-Administered Medications  Medication Dose Route Frequency Provider Last Rate Last Admin   0.9 %  sodium chloride infusion   Intravenous Continuous Brian Sparrow, MD       carvedilol (COREG) 12.5 MG tablet  ceFAZolin (ANCEF) 2-4 GM/100ML-% IVPB            ceFAZolin (ANCEF) IVPB 2g/100 mL premix  2 g Intravenous 30 min Pre-Op Brian Sparrow, MD       Chlorhexidine Gluconate Cloth 2 % PADS 6 each  6 each Topical Once Brian Sparrow, MD       And   Chlorhexidine Gluconate Cloth 2 % PADS 6 each  6 each Topical Once Brian Sparrow, MD       lactated ringers infusion   Intravenous Continuous Stoltzfus, Gregory P, DO        No Known Allergies   REVIEW OF SYSTEMS:  [X]  denotes positive finding, [ ]  denotes negative finding Cardiac  Comments:  Chest pain or chest pressure:    Shortness of breath upon exertion:    Short of breath when lying flat:    Irregular heart rhythm:        Vascular    Pain in calf, thigh, or hip brought  on by ambulation: X   Pain in feet at night that wakes you up from your sleep:     Blood clot in your veins:    Leg swelling:         Pulmonary    Oxygen at home:    Productive cough:     Wheezing:         Neurologic    Sudden weakness in arms or legs:     Sudden numbness in arms or legs:     Sudden onset of difficulty speaking or slurred speech:    Temporary loss of vision in one eye:     Problems with dizziness:         Gastrointestinal    Blood in stool:     Vomited blood:         Genitourinary    Burning when urinating:     Blood in urine:        Psychiatric    Major depression:         Hematologic    Bleeding problems:    Problems with blood clotting too easily:        Skin    Rashes or ulcers:        Constitutional    Fever or chills:      PHYSICAL EXAMINATION:  Vitals:   10/01/21 0551  BP: 114/71  Pulse: 81  Resp: 18  Temp: 98.7 F (37.1 C)  TempSrc: Oral  SpO2: 98%  Weight: 78.5 kg  Height: 6\' 1"  (1.854 m)    General:  WDWN in NAD; vital signs documented above Gait: Not observed HENT: WNL, normocephalic Pulmonary: normal non-labored breathing , without wheezing Cardiac: regular HR Abdomen: soft, NT, no masses Skin: without rashes Vascular Exam/Pulses:  Right Left  Radial 2+ (normal) 2+ (normal)  Ulnar 2+ (normal) 2+ (normal)  Femoral 1+ (weak) 1+ (weak)  Popliteal    DP absent absent  PT absent absent   Extremities: without ischemic changes, without Gangrene , without cellulitis; without open wounds;  Musculoskeletal: no muscle wasting or atrophy  Neurologic: A&O X 3;  No focal weakness or paresthesias are detected Psychiatric:  The pt has Normal affect.   Non-Invasive Vascular Imaging:     Interval imaging of the chest abdomen pelvis demonstrates no aneurysmal dilatation of the aorta, however there is significant narrowing in the infrarenal segment from the false lumen.  There appears to be 2 fenestrations 1 in the aortic arch  immediately distal to the left subclavian, and distally at the level of the renal arteries.  Both renal arteries are patent with the left renal artery being perfused off of the false lumen.    ASSESSMENT/PLAN: Brian Cannon is a 61 y.o. 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  Brian Cannon would benefit from subclavian transposition, thoracic endovascular aortic repair in an effort to seal the proximal fenestration, decrease false lumen filling distally, and hopefully prevent future aneurysmal degeneration.  I am worried that if we wait until the chronic phase, the septum may become less pliable. I discussed this is several of my partners who agree that repair is in Brian Cannon's best interest.  I had a long discussion with  Brian Cannon and his family regarding the above we discussed complications including death, paralysis, renal failure, swallowing difficulties, damage to adjacent structures, hoarseness, infection, need for further surgery.  After discussing the risk and benefits, he elected to proceed.     Brian Sparrow, MD [facility] [phone]

## 2021-10-01 NOTE — Op Note (Signed)
NAMEEligha Kmetz    MRN: [id number] DOB: 1960/05/14    DATE OF OPERATION: 10/01/2021  PREOP DIAGNOSIS:    Zone 3 type B aortic dissection  POSTOP DIAGNOSIS:    Same  PROCEDURE:    Left subclavian artery transposition Ultrasound-guided micropuncture access of the right common femoral artery Intravascular aortic ultrasound TEVAR-with left subclavian artery coverage 34 x 34 x 20 cm Cook Zenith dissection endovascular stent [id number]  SURGEON: Victorino Sparrow  ASSIST: Lemar Livings, MD  ANESTHESIA: General  EBL: 150 mL  INDICATIONS:    Brian Cannon is a 61 y.o. 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   Waris would benefit from subclavian transposition, thoracic endovascular aortic repair in an effort to seal the proximal fenestration, decrease false lumen filling distally, and hopefully prevent future aneurysmal degeneration.  I am worried that if we wait until the chronic phase, the septum may become less pliable. I discussed this is several of my partners who agree that repair is in June's best interest.  I had a long discussion with  Ladarrion and his family regarding the above we discussed complications including death, paralysis, renal failure, swallowing difficulties, damage to adjacent structures, hoarseness, infection, need for further surgery.  After discussing the risk and benefits, he elected to proceed.  FINDINGS:   Zone 3 type B aortic dissection with fenestrations in zone 3, zone 9  TECHNIQUE:   Patient was brought to the OR laid in the supine position.  General anesthesia was induced and the patient was prepped and draped in standard fashion.  The case began with a transverse incision in the left neck 1 fingerbreadth  above the clavicle at the clavicular insertion of the sternocleidomastoid muscle.  This incision was carried down through the platysma.  The lateral portion of the sternocleidomastoid muscle was taken down.  The supraclavicular fat pad was moved cephalad with ligation of multiple veins and lymphatics.  The thoracic duct was identified and ligated as well.  Next, the phrenic nerve was identified with special care taken to avoid injury.  The anterior scalene muscle was taken down exposing the subclavian artery.  Dissection continued along the anterior surface of the subclavian artery.  The vertebral vein was identified and ligated.  The vertebral artery was controlled with a vessel loop.  The subclavian artery was then followed into the chest, and loop.  There was no pleural injury identified.  Next, my attention turned to exposing the common carotid artery.  Dissection moved to medial and the internal jugular vein was identified.  Next, the vagus nerve was identified with special care taken to avoid injury.  The left common carotid artery was identified, isolated and controlled with use of vessel loop.  At this point, heparin was administered to an ACT greater than 250.  The subclavian artery was controlled with the proximal portion double suture ligated.  A large clip was placed below for safety.  Next, the left carotid artery was controlled.  A longitudinal arteriotomy was made and a 5 mm aortic punch used.  The left subclavian artery was sewn in end-to-side fashion to the left common carotid artery using 5-0 Prolene suture in running fashion.  Prior to completion of the anastomosis, all arteries were backbled to ensure there was no thrombotic burden  or air that could travel distally in the carotid artery.  Upon completion, Doppler ultrasound was brought to the field demonstrating excellent signals proximally and distally.  The wound was left open with a lap pad placed.  Next, my attention turned to thoracic  endovascular aortic repair.  The right common femoral artery was accessed using an ultrasound-guided micropuncture needle.  From this I wire was taken into the descending thoracic aorta.  2 Pro-glide devices were used in preclose technique followed by a short 8 [place] sheath.  A catheter was used to traverse the aortic arch and a Lunderquist wire was parked in the ascending aorta.  Next, intravascular ultrasound was used to ensure the wire was in the true lumen.  The visceral vessels were appreciated with significant luminal narrowing in the visceral and infrarenal segments.  Once treatment lumen was confirmed, the 22 French sheath was brought onto the field and parked in the visceral aorta.  The device was delivered into the aortic arch.  Angiography followed under breath-hold demonstrating a bovine variant involving the innominate and left carotid artery.  The ligated stump of the left subclavian artery was appreciated.  Next, the Gore C tag 34 x 34 x 20 cm device was deployed in standard fashion.  Follow-up imaging demonstrated excellent result with coverage of the left subclavian artery, and intra-arterial.  Distally there was still significant narrowing in the visceral segment.  I elected to place a Cook Zenith endovascular dissection stent from the [place] device to the celiac artery.  I elected not to cover this to reduce the risk of spinal ischemia.  Follow-up angiography demonstrated improvement in visceral and infrarenal true lumen diameter, however it remained compressed.  The left renal artery was filling off of the false lumen, and therefore I elected not to continue the pedicle technique through the visceral segment as I was concerned doing so could lead to left renal artery ischemia by compressing the false lumen.  I was also concerned that petticoat technique through this segment would limit future treatment options should aneurysmal dilatation occur.    The 22 French sheath was removed with  deployment of the Pro-glide devices.  A third Pro-glide was needed for access management.  There were excellent signals in the feet, therefore heparin was reversed with protamine.  Next, I moved back to the left neck for closure.  The wound bed was copiously irrigated.  Valsalva demonstrated no concern for pleural injury.  No chyle was appreciated in the wound bed.  The neck was closed in layers of Vicryl suture with a drain placed over the supraclavicular fat pad.  The skin was closed using Monocryl and Dermabond.  The patient was taken to PACU in stable condition.  Ladonna Snide, MD [facility] DATE OF DICTATION:   10/01/2021

## 2021-10-01 NOTE — Progress Notes (Signed)
Looks great on evening check. Palpable left radial pulse; bilateral dorsalis pedis pulses Incisions clean and dry. Left neck JP serosanguinous. BP / A line discordant. OK to use BP.   Rande Brunt. Lenell Antu, MD [facility] Phone Number: [phone] 10/01/2021 7:43 PM

## 2021-10-01 NOTE — Transfer of Care (Signed)
Immediate Anesthesia Transfer of Care Note  Patient: Brian Cannon  Procedure(s) Performed: BYPASS GRAFT LEFT CAROTID-SUBCLAVIAN (Left: Chest) THORACIC AORTIC ENDOVASCULAR STENT GRAFT (Chest) ULTRASOUND GUIDANCE FOR VASCULAR ACCESS, RIGHT FEMORAL ARTERY (Right: Groin)  Patient Location: PACU  Anesthesia Type:General  Level of Consciousness: awake, alert  and oriented  Airway & Oxygen Therapy: Patient connected to nasal cannula oxygen  Post-op Assessment: Post -op Vital signs reviewed and stable  Post vital signs: stable  Last Vitals:  Vitals Value Taken Time  BP 125/72 10/01/21 1202  Temp    Pulse 85 10/01/21 1204  Resp 12 10/01/21 1204  SpO2 98 % 10/01/21 1204  Vitals shown include unvalidated device data.  Last Pain:  Vitals:   10/01/21 0634  TempSrc:   PainSc: 0-No pain         Complications: No notable events documented.

## 2021-10-01 NOTE — Progress Notes (Signed)
Paged vascular on-call to make them aware that Aline and cuff pressure are not correlating and that aline has a whip and want to treat the correct blood pressure because aline is reading much higher than cuff. Dr. Juanetta Gosling called back and stated he would come see the patient shortly.

## 2021-10-01 NOTE — Progress Notes (Signed)
Dr. Juanetta Gosling came and assessed patient. MD gave order to administer PRN hypertensive medications based off patient's cuff pressure, not arterial line.

## 2021-10-01 NOTE — Anesthesia Postprocedure Evaluation (Signed)
Anesthesia Post Note  Patient: [name]  Procedure(s) Performed: BYPASS GRAFT LEFT CAROTID-SUBCLAVIAN (Left: Chest) THORACIC AORTIC ENDOVASCULAR STENT GRAFT (Chest) ULTRASOUND GUIDANCE FOR VASCULAR ACCESS, RIGHT FEMORAL ARTERY (Right: Groin)     Patient location during evaluation: PACU Anesthesia Type: General Level of consciousness: awake and alert Pain management: pain level controlled Vital Signs Assessment: post-procedure vital signs reviewed and stable Respiratory status: spontaneous breathing, nonlabored ventilation, respiratory function stable and patient connected to nasal cannula oxygen Cardiovascular status: blood pressure returned to baseline and stable Postop Assessment: no apparent nausea or vomiting Anesthetic complications: no   No notable events documented.  Last Vitals:  Vitals:   10/01/21 2000 10/01/21 2100  BP: 133/85 133/71  Pulse: 89 85  Resp: 15 18  Temp: 36.5 C   SpO2: 99% 99%    Last Pain:  Vitals:   10/01/21 2100  TempSrc:   PainSc: 7                  Emmersen Garraway P Merla Sawka

## 2021-10-01 NOTE — Anesthesia Procedure Notes (Signed)
Arterial Line Insertion Start/End7/20/2023 7:10 AM, 10/01/2021 7:15 AM Performed by: Atilano Median, DO, Waynard Edwards, CRNA  Patient location: Pre-op. Preanesthetic checklist: patient identified, IV checked, site marked, risks and benefits discussed, surgical consent, monitors and equipment checked, pre-op evaluation, timeout performed and anesthesia consent Lidocaine 1% used for infiltration Right, radial was placed Catheter size: 20 G Hand hygiene performed  and maximum sterile barriers used   Attempts: 1 Procedure performed without using ultrasound guided technique. Following insertion, dressing applied and Biopatch. Post procedure assessment: normal and unchanged  Patient tolerated the procedure well with no immediate complications.

## 2021-10-01 NOTE — Anesthesia Procedure Notes (Signed)
Procedure Name: Intubation Date/Time: 10/01/2021 7:48 AM  Performed by: Griffin Dakin, CRNAPre-anesthesia Checklist: Patient identified, Emergency Drugs available, Suction available and Patient being monitored Patient Re-evaluated:Patient Re-evaluated prior to induction Oxygen Delivery Method: Circle system utilized Preoxygenation: Pre-oxygenation with 100% oxygen Induction Type: IV induction and Rapid sequence Laryngoscope Size: Mac and 4 Grade View: Grade II Tube type: Oral Tube size: 7.5 mm Number of attempts: 1 Airway Equipment and Method: Stylet and Oral airway Placement Confirmation: ETT inserted through vocal cords under direct vision, positive ETCO2 and breath sounds checked- equal and bilateral Secured at: 24 cm Tube secured with: Tape Dental Injury: Teeth and Oropharynx as per pre-operative assessment

## 2021-10-02 ENCOUNTER — Encounter (HOSPITAL_COMMUNITY): Payer: Self-pay | Admitting: Vascular Surgery

## 2021-10-02 LAB — BASIC METABOLIC PANEL
Anion gap: 10 (ref 5–15)
BUN: 11 mg/dL (ref 8–23)
CO2: 22 mmol/L (ref 22–32)
Calcium: 8.6 mg/dL — ABNORMAL LOW (ref 8.9–10.3)
Chloride: 105 mmol/L (ref 98–111)
Creatinine, Ser: 0.86 mg/dL (ref 0.61–1.24)
GFR, Estimated: >60 mL/min (ref >60)
Glucose, Bld: 121 mg/dL — ABNORMAL HIGH (ref 70–99)
Potassium: 3.7 mmol/L (ref 3.5–5.1)
Sodium: 137 mmol/L (ref 135–145)

## 2021-10-02 LAB — CBC
HCT: 32.6 % — ABNORMAL LOW (ref 39.0–52.0)
Hemoglobin: 11.6 g/dL — ABNORMAL LOW (ref 13.0–17.0)
MCH: 33.1 pg (ref 26.0–34.0)
MCHC: 35.6 g/dL (ref 30.0–36.0)
MCV: 93.1 fL (ref 80.0–100.0)
Platelets: 148 10*3/uL — ABNORMAL LOW (ref 150–400)
RBC: 3.5 MIL/uL — ABNORMAL LOW (ref 4.22–5.81)
RDW: 12.8 % (ref 11.5–15.5)
WBC: 10.7 10*3/uL — ABNORMAL HIGH (ref 4.0–10.5)
nRBC: 0 % (ref 0.0–0.2)

## 2021-10-02 MED ORDER — OCTREOTIDE ACETATE 100 MCG/ML IJ SOLN
100.0000 ug | Freq: Three times a day (TID) | INTRAMUSCULAR | Status: DC
  Administered 2021-10-02 – 2021-10-04 (×4): 100 ug via SUBCUTANEOUS
  Filled 2021-10-02 (×6): qty 1

## 2021-10-02 NOTE — Progress Notes (Addendum)
[facility] [place]  Subjective  - Doing great walking voiding, tol PO's   Objective 136/73 82 98 F (36.7 C) (Oral) 18 98%  Intake/Output Summary (Last 24 hours) at 10/02/2021 2355 Last data filed at 10/02/2021 0700 Gross per 24 hour  Intake 3309.35 ml  Output 4365 ml  Net -1055.65 ml    Left neck incision healing well JP Op clear 200 cc total Palpable left radial pulse, palpable DP pulses Right groin soft, healing well Lungs non labored breathing   Assessment/Planning: POD # 1   Left subclavian artery transposition Ultrasound-guided micropuncture access of the right common femoral artery Intravascular aortic ultrasound TEVAR-with left subclavian artery coverage 34 x 34 x 20 cm Cook Zenith dissection endovascular stent 46x138mm  Stable disposition will D/C A line and JP will likely come out today pending discussion with DR. Chonita Gadea.  Possible discharge tomorrow will transfer to 4E.  Brian Cannon 10/02/2021 7:38 AM --  VASCULAR STAFF ADDENDUM: I have independently interviewed and examined the patient. I agree with the above.  Transfer to floor, foley, aline out. Leave JP drain  Fara Olden, MD [facility] Phone Number: [phone] 10/02/2021 8:31 AM     Laboratory Lab Results: Recent Labs    10/01/21 1314 10/02/21 0423  WBC 9.3 10.7*  HGB 11.7* 11.6*  HCT 32.3* 32.6*  PLT 151 148*   BMET Recent Labs    10/01/21 1314 10/02/21 0423  NA 137 137  K 3.7 3.7  CL 107 105  CO2 23 22  GLUCOSE 139* 121*  BUN 20 11  CREATININE 1.08 0.86  CALCIUM 8.4* 8.6*    COAG Lab Results  Component Value Date   INR 1.2 10/01/2021   INR 1.0 09/28/2021   INR 1.0 06/06/2021   No results found for: "PTT"

## 2021-10-03 ENCOUNTER — Encounter (HOSPITAL_COMMUNITY): Payer: Self-pay | Admitting: Vascular Surgery

## 2021-10-03 LAB — BASIC METABOLIC PANEL
Anion gap: 9 (ref 5–15)
BUN: 9 mg/dL (ref 8–23)
CO2: 28 mmol/L (ref 22–32)
Calcium: 8.4 mg/dL — ABNORMAL LOW (ref 8.9–10.3)
Chloride: 95 mmol/L — ABNORMAL LOW (ref 98–111)
Creatinine, Ser: 1.04 mg/dL (ref 0.61–1.24)
GFR, Estimated: >60 mL/min (ref >60)
Glucose, Bld: 118 mg/dL — ABNORMAL HIGH (ref 70–99)
Potassium: 3.3 mmol/L — ABNORMAL LOW (ref 3.5–5.1)
Sodium: 132 mmol/L — ABNORMAL LOW (ref 135–145)

## 2021-10-03 LAB — CBC
HCT: 34.2 % — ABNORMAL LOW (ref 39.0–52.0)
Hemoglobin: 12.2 g/dL — ABNORMAL LOW (ref 13.0–17.0)
MCH: 33.3 pg (ref 26.0–34.0)
MCHC: 35.7 g/dL (ref 30.0–36.0)
MCV: 93.4 fL (ref 80.0–100.0)
Platelets: 131 10*3/uL — ABNORMAL LOW (ref 150–400)
RBC: 3.66 MIL/uL — ABNORMAL LOW (ref 4.22–5.81)
RDW: 12.5 % (ref 11.5–15.5)
WBC: 10.2 10*3/uL (ref 4.0–10.5)
nRBC: 0 % (ref 0.0–0.2)

## 2021-10-03 MED ORDER — CEFAZOLIN SODIUM-DEXTROSE 1-4 GM/50ML-% IV SOLN
1.0000 g | Freq: Three times a day (TID) | INTRAVENOUS | Status: AC
  Administered 2021-10-03 – 2021-10-04 (×5): 1 g via INTRAVENOUS
  Filled 2021-10-03 (×7): qty 50

## 2021-10-03 MED ORDER — SODIUM CHLORIDE 0.9 % IV SOLN: INTRAVENOUS | Status: DC | PRN

## 2021-10-03 NOTE — Progress Notes (Signed)
[facility] [place]  Subjective  - some pain with deep inspiration, incentive spirometry helping   Objective (!) 105/52 83 98.1 F (36.7 C) (Oral) 18 97%  Intake/Output Summary (Last 24 hours) at 10/03/2021 0735 Last data filed at 10/03/2021 0000 Gross per 24 hour  Intake 340 ml  Output 640 ml  Net -300 ml     Left neck incision healing well JP Op cloudy - output decreased from previous day Palpable left radial pulse, palpable DP pulses Right groin soft, healing well Lungs non labored breathing   Assessment/Planning: POD # 1   Left subclavian artery transposition Ultrasound-guided micropuncture access of the right common femoral artery Intravascular aortic ultrasound TEVAR-with left subclavian artery coverage 34 x 34 x 20 cm Cook Zenith dissection endovascular stent 46x148mm  Chyle appreciate in drain yesterday, less output today. SS today.  Octreotide started, Liquid diet (Nonfat).  OOB today. No restrictions. Dispo pending drain improvement.  Floor  Victorino Sparrow 10/03/2021 7:35 AM --      Laboratory Lab Results: Recent Labs    10/01/21 1314 10/02/21 0423  WBC 9.3 10.7*  HGB 11.7* 11.6*  HCT 32.3* 32.6*  PLT 151 148*    BMET Recent Labs    10/01/21 1314 10/02/21 0423  NA 137 137  K 3.7 3.7  CL 107 105  CO2 23 22  GLUCOSE 139* 121*  BUN 20 11  CREATININE 1.08 0.86  CALCIUM 8.4* 8.6*     COAG Lab Results  Component Value Date   INR 1.2 10/01/2021   INR 1.0 09/28/2021   INR 1.0 06/06/2021   No results found for: "PTT"

## 2021-10-04 ENCOUNTER — Other Ambulatory Visit: Payer: Self-pay

## 2021-10-04 ENCOUNTER — Inpatient Hospital Stay (HOSPITAL_COMMUNITY): Payer: No Typology Code available for payment source

## 2021-10-04 DIAGNOSIS — I71019 Dissection of thoracic aorta, unspecified: Secondary | ICD-10-CM

## 2021-10-04 LAB — COMPREHENSIVE METABOLIC PANEL WITH GFR
ALT: 12 U/L (ref 0–44)
AST: 12 U/L — ABNORMAL LOW (ref 15–41)
Albumin: 3.1 g/dL — ABNORMAL LOW (ref 3.5–5.0)
Alkaline Phosphatase: 55 U/L (ref 38–126)
Anion gap: 9 (ref 5–15)
BUN: 10 mg/dL (ref 8–23)
CO2: 30 mmol/L (ref 22–32)
Calcium: 8.5 mg/dL — ABNORMAL LOW (ref 8.9–10.3)
Chloride: 95 mmol/L — ABNORMAL LOW (ref 98–111)
Creatinine, Ser: 1.17 mg/dL (ref 0.61–1.24)
GFR, Estimated: >60 mL/min
Glucose, Bld: 149 mg/dL — ABNORMAL HIGH (ref 70–99)
Potassium: 3.3 mmol/L — ABNORMAL LOW (ref 3.5–5.1)
Sodium: 134 mmol/L — ABNORMAL LOW (ref 135–145)
Total Bilirubin: 0.8 mg/dL (ref 0.3–1.2)
Total Protein: 6.8 g/dL (ref 6.5–8.1)

## 2021-10-04 LAB — CBC
HCT: 33.3 % — ABNORMAL LOW (ref 39.0–52.0)
Hemoglobin: 11.9 g/dL — ABNORMAL LOW (ref 13.0–17.0)
MCH: 33.1 pg (ref 26.0–34.0)
MCHC: 35.7 g/dL (ref 30.0–36.0)
MCV: 92.5 fL (ref 80.0–100.0)
Platelets: 134 K/uL — ABNORMAL LOW (ref 150–400)
RBC: 3.6 MIL/uL — ABNORMAL LOW (ref 4.22–5.81)
RDW: 12.2 % (ref 11.5–15.5)
WBC: 10.4 K/uL (ref 4.0–10.5)
nRBC: 0 % (ref 0.0–0.2)

## 2021-10-04 LAB — LACTIC ACID, PLASMA: Lactic Acid, Venous: 1.2 mmol/L (ref 0.5–1.9)

## 2021-10-04 MED ORDER — SODIUM CHLORIDE 0.9 % IV BOLUS
1000.0000 mL | Freq: Once | INTRAVENOUS | Status: AC
  Administered 2021-10-04: 1000 mL via INTRAVENOUS

## 2021-10-04 MED ORDER — IOHEXOL 350 MG/ML SOLN
100.0000 mL | Freq: Once | INTRAVENOUS | Status: AC | PRN
  Administered 2021-10-04: 100 mL via INTRAVENOUS

## 2021-10-04 MED ORDER — SENNA 8.6 MG PO TABS
1.0000 | ORAL_TABLET | Freq: Two times a day (BID) | ORAL | Status: DC | PRN
  Administered 2021-10-07: 8.6 mg via ORAL
  Filled 2021-10-04: qty 1

## 2021-10-04 MED ORDER — POLYETHYLENE GLYCOL 3350 17 G PO PACK
17.0000 g | PACK | Freq: Every day | ORAL | Status: DC
  Administered 2021-10-04 – 2021-10-08 (×5): 17 g via ORAL
  Filled 2021-10-04 (×5): qty 1

## 2021-10-04 NOTE — Progress Notes (Addendum)
  Progress Note    10/04/2021 7:52 AM 3 Days Post-Op  Subjective:  cramping sharp abdominal pains since yesterday. Nausea and several episodes of vomiting. Episode occurred after percocet given and latera episode after octreotide so unsure if related. Passing flatus. No BM since 7/20   Vitals:   10/03/21 2328 10/04/21 0400  BP: (!) 102/52 117/60  Pulse: 79 91  Resp: 20 15  Temp: 98.1 F (36.7 C) 99.6 F (37.6 C)  SpO2: 100% 98%   Physical Exam: Cardiac:  regular Lungs:  non labored Incisions:  left neck incision is intact and well appearing. JP with minimal SS output Extremities:  palpable left radial pulse. BLE well perfused and warm Abdomen:  soft, non distended. TTP in epigastric area Neurologic: alert and oriented  CBC    Component Value Date/Time   WBC 10.2 10/03/2021 0734   RBC 3.66 (L) 10/03/2021 0734   HGB 12.2 (L) 10/03/2021 0734   HCT 34.2 (L) 10/03/2021 0734   PLT 131 (L) 10/03/2021 0734   MCV 93.4 10/03/2021 0734   MCH 33.3 10/03/2021 0734   MCHC 35.7 10/03/2021 0734   RDW 12.5 10/03/2021 0734   LYMPHSABS 2.6 10/14/2013 1719   MONOABS 0.6 10/14/2013 1719   EOSABS 0.0 10/14/2013 1719   BASOSABS 0.0 10/14/2013 1719    BMET    Component Value Date/Time   NA 132 (L) 10/03/2021 0734   NA 145 (H) 07/01/2021 1113   K 3.3 (L) 10/03/2021 0734   CL 95 (L) 10/03/2021 0734   CO2 28 10/03/2021 0734   GLUCOSE 118 (H) 10/03/2021 0734   BUN 9 10/03/2021 0734   BUN 13 07/01/2021 1113   CREATININE 1.04 10/03/2021 0734   CALCIUM 8.4 (L) 10/03/2021 0734   GFRNONAA >60 10/03/2021 0734   GFRAA >60 06/20/2017 1502    INR    Component Value Date/Time   INR 1.2 10/01/2021 1314     Intake/Output Summary (Last 24 hours) at 10/04/2021 0752 Last data filed at 10/04/2021 0630 Gross per 24 hour  Intake 174.19 ml  Output 41 ml  Net 133.19 ml     Assessment/Plan:  61 y.o. male is s/p Left subclavian artery transposition Ultrasound-guided micropuncture access  of the right common femoral artery Intravascular aortic ultrasound TEVAR-with left subclavian artery coverage 34 x 34 x 20 cm Cook Zenith dissection endovascular stent 46x13mm 3 Days Post-Op   In significant abdominal pain this morning Overall stable appearing. VSS Pressures slightly soft overnight. Improved with fluid bolus Palpable left radial pulse Incision is clean and dry JP drain with minimal SS output 40 cc /24 hr. Only 15 overnight Bowel regimen Continue to mobilize as tolerated Liquid diet(nonfat) possible transition to non fat diet today pending CT scan results Due to significant abdominal pain order placed for CTA chest/abd/ pelvis   Graceann Congress, PA-C [facility] [phone] 10/04/2021 7:52 AM   VASCULAR STAFF ADDENDUM: I have independently interviewed and examined the patient. I agree with the above.  Abdominal pain this morning improved.  CT reviewed, visceral vessels patent with improved luminal diameter s/p stenting.  Will follow up mesenteric duplex to ensure there is not a dynamic component the infrarenal dissection flap. No peritonitis. No guarding.  Will increase BR Continue clears Will follow up labs Administration of octreotide can cause abdominal pain. Will hold further administration at this time.   Fara Olden, MD [facility] Phone Number: [phone] 10/04/2021 9:57 AM

## 2021-10-04 NOTE — Progress Notes (Signed)
Mesenteric artery duplex study completed. Please see CV Proc for preliminary results.  Herold Salguero BS, RVT 10/04/2021 1:04 PM

## 2021-10-05 LAB — GLUCOSE, CAPILLARY: Glucose-Capillary: 121 mg/dL — ABNORMAL HIGH (ref 70–99)

## 2021-10-05 MED ORDER — BISACODYL 10 MG RE SUPP
10.0000 mg | Freq: Every day | RECTAL | Status: DC | PRN
  Administered 2021-10-05 – 2021-10-07 (×2): 10 mg via RECTAL
  Filled 2021-10-05 (×2): qty 1

## 2021-10-05 MED ORDER — SODIUM CHLORIDE 0.9 % IV SOLN
Freq: Once | INTRAVENOUS | Status: DC
  Filled 2021-10-05: qty 10

## 2021-10-05 MED ORDER — FLEET ENEMA 7-19 GM/118ML RE ENEM
1.0000 | ENEMA | Freq: Once | RECTAL | Status: AC
  Administered 2021-10-05: 1 via RECTAL
  Filled 2021-10-05: qty 1

## 2021-10-05 MED ORDER — SODIUM CHLORIDE 0.9 % IV SOLN: INTRAVENOUS | Status: DC | PRN

## 2021-10-05 NOTE — Progress Notes (Signed)
Mobility Specialist: Progress Note   10/05/21 1700  Mobility  Activity Ambulated with assistance in hallway  Level of Assistance Minimal assist, patient does 75% or more  Assistive Device None  Distance Ambulated (ft) 470 ft  Activity Response Tolerated well  $Mobility charge 1 Mobility   Pre-Mobility: 103 HR, 97% SpO2 During Mobility: 114 HR Post-Mobility: 105 HR, 110/63 (74) BP, 98% SpO2  Pt received in the bed and agreeable to mobility. Mild unsteadiness throughout with LOB x1 when turning around at the end of the hallway requiring minA to correct. C/o general weakness during ambulation. Pt back to bed after session with call bell in reach.   [facility] Jashiya Bassett Mobility Specialist Mobility Specialist [address]: [phone]

## 2021-10-05 NOTE — Progress Notes (Addendum)
  Progress Note    10/05/2021 9:16 AM 4 Days Post-Op  Subjective:  still intermittent abdominal pain. Better with cold compresses. Small bowel movement yesterday and this morning   Vitals:   10/05/21 0406 10/05/21 0832  BP: (!) 129/56   Pulse: 85   Resp: 20   Temp: 98.7 F (37.1 C) 98.6 F (37 C)  SpO2: 94%    Physical Exam: Cardiac:  regular Lungs:  non labored Incisions:  incision is intact and well appearing. JP drain with < 10 cc of SS drainage Extremities:  well perfused and warm. Palpable 2+ L radial pulse. 2+ bilateral Dp pulses  Neurologic: alert and oriented  CBC    Component Value Date/Time   WBC 10.4 10/04/2021 1006   RBC 3.60 (L) 10/04/2021 1006   HGB 11.9 (L) 10/04/2021 1006   HCT 33.3 (L) 10/04/2021 1006   PLT 134 (L) 10/04/2021 1006   MCV 92.5 10/04/2021 1006   MCH 33.1 10/04/2021 1006   MCHC 35.7 10/04/2021 1006   RDW 12.2 10/04/2021 1006   LYMPHSABS 2.6 10/14/2013 1719   MONOABS 0.6 10/14/2013 1719   EOSABS 0.0 10/14/2013 1719   BASOSABS 0.0 10/14/2013 1719    BMET    Component Value Date/Time   NA 134 (L) 10/04/2021 1006   NA 145 (H) 07/01/2021 1113   K 3.3 (L) 10/04/2021 1006   CL 95 (L) 10/04/2021 1006   CO2 30 10/04/2021 1006   GLUCOSE 149 (H) 10/04/2021 1006   BUN 10 10/04/2021 1006   BUN 13 07/01/2021 1113   CREATININE 1.17 10/04/2021 1006   CALCIUM 8.5 (L) 10/04/2021 1006   GFRNONAA >60 10/04/2021 1006   GFRAA >60 06/20/2017 1502    INR    Component Value Date/Time   INR 1.2 10/01/2021 1314     Intake/Output Summary (Last 24 hours) at 10/05/2021 0916 Last data filed at 10/05/2021 0032 Gross per 24 hour  Intake --  Output 10 ml  Net -10 ml     Assessment/Plan:  61 y.o. male is s/p  Left subclavian artery transposition Ultrasound-guided micropuncture access of the right common femoral artery Intravascular aortic ultrasound TEVAR-with left subclavian artery coverage 34 x 34 x 20 cm Cook Zenith dissection endovascular  stent 46x134mm 4 Days Post-Op   Abdominal pain intermittent. Better with cold application CT stable and mesenteric duplex shows no dynamic component to the infrarenal dissection flap Passing flatus. Small bowel movement yesterday and this morning. Continue Bowel regimen Left neck incision is clean, dry and intact Jp with minimal SS output 10 cc Left upper extremity well perfused and warm with 2+ radial pulse Lower extremities well perfused with palpable Dp pulses bilaterally Pressures soft overnight Low fat diet this morning Continue to mobilize as tolerated  Graceann Congress, PA-C [facility] [phone] 10/05/2021 9:16 AM  VASCULAR STAFF ADDENDUM: I have independently interviewed and examined the patient. I agree with the above.  Abd pain improved when seen. Able to eat half of a sandwich, but appreciated some after. Now asymptomatic.  Feels constipated.  Low fat diet today. Mobilize. Aggressive bowel regimen. Drain output minimal. Will continue to follow.  Fara Olden, MD [facility] Phone Number: [phone] 10/05/2021 2:16 PM

## 2021-10-06 LAB — BASIC METABOLIC PANEL
Anion gap: 11 (ref 5–15)
BUN: 9 mg/dL (ref 8–23)
CO2: 30 mmol/L (ref 22–32)
Calcium: 8.4 mg/dL — ABNORMAL LOW (ref 8.9–10.3)
Chloride: 95 mmol/L — ABNORMAL LOW (ref 98–111)
Creatinine, Ser: 0.99 mg/dL (ref 0.61–1.24)
GFR, Estimated: >60 mL/min (ref >60)
Glucose, Bld: 116 mg/dL — ABNORMAL HIGH (ref 70–99)
Potassium: 3 mmol/L — ABNORMAL LOW (ref 3.5–5.1)
Sodium: 136 mmol/L (ref 135–145)

## 2021-10-06 LAB — CBC
HCT: 29.7 % — ABNORMAL LOW (ref 39.0–52.0)
Hemoglobin: 10.4 g/dL — ABNORMAL LOW (ref 13.0–17.0)
MCH: 32.6 pg (ref 26.0–34.0)
MCHC: 35 g/dL (ref 30.0–36.0)
MCV: 93.1 fL (ref 80.0–100.0)
Platelets: 184 10*3/uL (ref 150–400)
RBC: 3.19 MIL/uL — ABNORMAL LOW (ref 4.22–5.81)
RDW: 12.3 % (ref 11.5–15.5)
WBC: 9.3 10*3/uL (ref 4.0–10.5)
nRBC: 0 % (ref 0.0–0.2)

## 2021-10-06 MED ORDER — CARVEDILOL 12.5 MG PO TABS
12.5000 mg | ORAL_TABLET | Freq: Two times a day (BID) | ORAL | Status: DC
Start: 2021-10-06 — End: 2021-10-08
  Administered 2021-10-06 – 2021-10-08 (×4): 12.5 mg via ORAL
  Filled 2021-10-06 (×4): qty 1

## 2021-10-06 MED ORDER — AMLODIPINE BESYLATE 5 MG PO TABS
5.0000 mg | ORAL_TABLET | Freq: Every day | ORAL | Status: DC
  Administered 2021-10-06 – 2021-10-08 (×3): 5 mg via ORAL
  Filled 2021-10-06 (×3): qty 1

## 2021-10-06 NOTE — Progress Notes (Signed)
  Progress Note    10/06/2021 7:42 AM 5 Days Post-Op  Subjective:  No abd pain this morning. Feels better   Vitals:   10/05/21 2334 10/06/21 0437  BP: (!) 91/43 109/60  Pulse: 73 89  Resp: 17 15  Temp: 98.1 F (36.7 C) 98.8 F (37.1 C)  SpO2: 95% 95%   Physical Exam: Cardiac:  regular Lungs:  non labored Incisions:  incision is intact and well appearing. JP drain with < 10 cc of Serous drainage, no chyle Extremities:  well perfused and warm. Palpable 2+ L radial pulse. 2+ bilateral Dp pulses  Neurologic: alert and oriented  CBC    Component Value Date/Time   WBC 9.3 10/06/2021 0642   RBC 3.19 (L) 10/06/2021 0642   HGB 10.4 (L) 10/06/2021 0642   HCT 29.7 (L) 10/06/2021 0642   PLT 184 10/06/2021 0642   MCV 93.1 10/06/2021 0642   MCH 32.6 10/06/2021 0642   MCHC 35.0 10/06/2021 0642   RDW 12.3 10/06/2021 0642   LYMPHSABS 2.6 10/14/2013 1719   MONOABS 0.6 10/14/2013 1719   EOSABS 0.0 10/14/2013 1719   BASOSABS 0.0 10/14/2013 1719    BMET    Component Value Date/Time   NA 136 10/06/2021 0642   NA 145 (H) 07/01/2021 1113   K 3.0 (L) 10/06/2021 0642   CL 95 (L) 10/06/2021 0642   CO2 30 10/06/2021 0642   GLUCOSE 116 (H) 10/06/2021 0642   BUN 9 10/06/2021 0642   BUN 13 07/01/2021 1113   CREATININE 0.99 10/06/2021 0642   CALCIUM 8.4 (L) 10/06/2021 0642   GFRNONAA >60 10/06/2021 0642   GFRAA >60 06/20/2017 1502    INR    Component Value Date/Time   INR 1.2 10/01/2021 1314     Intake/Output Summary (Last 24 hours) at 10/06/2021 0742 Last data filed at 10/05/2021 2100 Gross per 24 hour  Intake 480 ml  Output 0 ml  Net 480 ml      Assessment/Plan:  61 y.o. male is s/p  Left subclavian artery transposition Ultrasound-guided micropuncture access of the right common femoral artery Intravascular aortic ultrasound TEVAR-with left subclavian artery coverage 34 x 34 x 20 cm Cook Zenith dissection endovascular stent 46x182mm 5 Days Post-Op   Decreased  Anti-HTN regimen due to soft pressures Abdominal pain improved, still feels constipated. CT stable and mesenteric duplex shows no dynamic component to the infrarenal dissection flap Left neck incision is clean, dry and intact Jp with minimal SS output 10 cc Left upper extremity well perfused and warm with 2+ radial pulse Lower extremities well perfused with palpable DP pulses bilaterally Continue to mobilize as tolerated  Possible drain pull this afternoon pending no issues Home in the coming days pending continues improvement.   Victorino Sparrow, [facility] [phone] 10/06/2021 7:42 AM

## 2021-10-06 NOTE — Progress Notes (Signed)
  Transition of Care [facility]) Screening Note   Patient Details  Name: Yaroslav Gombos Date of Birth: November 26, 1960   Transition of Care [facility]) CM/SW Contact:    Darrold Span, RN Phone Number: 10/06/2021, 3:01 PM    Transition of Care Department [facility]) has reviewed patient and no TOC needs have been identified at this time. We will continue to monitor patient advancement through interdisciplinary progression rounds. If new patient transition needs arise, please place a TOC consult.

## 2021-10-06 NOTE — Progress Notes (Signed)
Mobility Specialist Progress Note:   10/06/21 1220  Mobility  Activity Ambulated independently in hallway  Level of Assistance Standby assist, set-up cues, supervision of patient - no hands on  Assistive Device None  Distance Ambulated (ft) 470 ft  Activity Response Tolerated well  $Mobility charge 1 Mobility   Pre-Mobility: 96 HR During Mobility: 110 HR Post Mobility: 98 HR  Pt in bed and agreeable. MinA to sit EOB. No complaints. Pt left sitting EOB with all needs met.   Rossi Burdo Mobility Specialist-Acute Rehab Secure Chat only

## 2021-10-07 NOTE — Progress Notes (Signed)
  Progress Note    10/07/2021 7:45 AM 6 Days Post-Op  Subjective:  No abd pain this morning. Feels better.    Vitals:   10/06/21 2348 10/07/21 0355  BP: (!) 103/54 (!) 113/52  Pulse: 76 84  Resp: 17 16  Temp: 98 F (36.7 C) 98 F (36.7 C)  SpO2: 97% 95%   Physical Exam: Cardiac:  regular Lungs:  non labored Incisions:  incision is intact and well appearing. JP drain with < 10 cc of Serous drainage, no chyle Extremities:  well perfused and warm. Palpable 2+ L radial pulse. 2+ bilateral Dp pulses  Neurologic: alert and oriented  CBC    Component Value Date/Time   WBC 9.3 10/06/2021 0642   RBC 3.19 (L) 10/06/2021 0642   HGB 10.4 (L) 10/06/2021 0642   HCT 29.7 (L) 10/06/2021 0642   PLT 184 10/06/2021 0642   MCV 93.1 10/06/2021 0642   MCH 32.6 10/06/2021 0642   MCHC 35.0 10/06/2021 0642   RDW 12.3 10/06/2021 0642   LYMPHSABS 2.6 10/14/2013 1719   MONOABS 0.6 10/14/2013 1719   EOSABS 0.0 10/14/2013 1719   BASOSABS 0.0 10/14/2013 1719    BMET    Component Value Date/Time   NA 136 10/06/2021 0642   NA 145 (H) 07/01/2021 1113   K 3.0 (L) 10/06/2021 0642   CL 95 (L) 10/06/2021 0642   CO2 30 10/06/2021 0642   GLUCOSE 116 (H) 10/06/2021 0642   BUN 9 10/06/2021 0642   BUN 13 07/01/2021 1113   CREATININE 0.99 10/06/2021 0642   CALCIUM 8.4 (L) 10/06/2021 0642   GFRNONAA >60 10/06/2021 0642   GFRAA >60 06/20/2017 1502    INR    Component Value Date/Time   INR 1.2 10/01/2021 1314     Intake/Output Summary (Last 24 hours) at 10/07/2021 0745 Last data filed at 10/06/2021 2000 Gross per 24 hour  Intake --  Output 0 ml  Net 0 ml      Assessment/Plan:  61 y.o. male is s/p  Left subclavian artery transposition Ultrasound-guided micropuncture access of the right common femoral artery Intravascular aortic ultrasound TEVAR-with left subclavian artery coverage 34 x 34 x 20 cm Cook Zenith dissection endovascular stent 46x148mm 6 Days Post-Op   JP pulled OOB,  continue to monitor BP Continue aggressive BR Home tomorrow pending no issues   Victorino Sparrow, [facility] [phone] 10/07/2021 7:45 AM

## 2021-10-08 ENCOUNTER — Ambulatory Visit: Payer: 59 | Admitting: Internal Medicine

## 2021-10-08 MED ORDER — ASPIRIN 81 MG PO TBEC
81.0000 mg | DELAYED_RELEASE_TABLET | Freq: Every day | ORAL | 12 refills | Status: AC
Start: 2021-10-09 — End: ?

## 2021-10-08 NOTE — Discharge Instructions (Signed)
  [facility] [place]   Discharge Instructions  Endovascular Aortic Aneurysm Repair  Please refer to the following instructions for your post-procedure care. Your surgeon or Physician Assistant will discuss any changes with you.  Activity  You are encouraged to walk as much as you can. You can slowly return to normal activities but must avoid strenuous activity and heavy lifting until your doctor tells you it's OK. Avoid activities such as vacuuming or swinging a gold club. It is normal to feel tired for several weeks after your surgery. Do not drive until your doctor gives the OK and you are no longer taking prescription pain medications. It is also normal to have difficulty with sleep habits, eating, and bowel movements after surgery. These will go away with time.  Bathing/Showering  Shower daily after you go home.  Do not soak in a bathtub, hot tub, or swim until the incision heals completely.  If you have incisions in your groin, wash the groin wounds with soap and water daily and pat dry. (No tub bath-only shower)  Then put a dry gauze or washcloth there to keep this area dry to help prevent wound infection daily and as needed.  Do not use Vaseline or neosporin on your incisions.  Only use soap and water on your incisions and then protect and keep dry.  Incision Care  Shower every day. Clean your incision with mild soap and water. Pat the area dry with a clean towel. You do not need a bandage unless otherwise instructed. Do not apply any ointments or creams to your incision. If you clothing is irritating, you may cover your incision with a dry gauze pad.  Diet  Resume your normal diet. There are no special food restrictions following this procedure. A low fat/low cholesterol diet is recommended for all patients with vascular disease. In order to heal from your surgery, it is CRITICAL to get adequate nutrition. Your body requires vitamins, minerals, and protein.  Vegetables are the best source of vitamins and minerals. Vegetables also provide the perfect balance of protein. Processed food has little nutritional value, so try to avoid this.  Medications  Resume taking all of your medications unless your doctor or nurse practitioner tells you not to. If your incision is causing pain, you may take over-the-counter pain relievers such as acetaminophen (Tylenol). If you were prescribed a stronger pain medication, please be aware these medications can cause nausea and constipation. Prevent nausea by taking the medication with a snack or meal. Avoid constipation by drinking plenty of fluids and eating foods with a high amount of fiber, such as fruits, vegetables, and grains.  Do not take Tylenol if you are taking prescription pain medications.   Follow up  Our office will schedule a follow-up appointment with a CT scan 3-4 weeks after your surgery.  Please call us immediately for any of the following conditions  Severe or worsening pain in your legs or feet or in your abdomen back or chest. Increased pain, redness, drainage (pus) from your incision site. Increased abdominal pain, bloating, nausea, vomiting or persistent diarrhea. Fever of 101 degrees or higher. Swelling in your leg (s),  Reduce your risk of vascular disease  Stop smoking. If you would like help call [facility] at 1-800-QUIT-NOW (1-800-784-8669) or [place] at [phone]. Manage your cholesterol Maintain a desired weight Control your diabetes Keep your blood pressure down  If you have questions, please call the office at [phone].  

## 2021-10-08 NOTE — Discharge Summary (Signed)
EV[facility] Discharge Summary   Brian Cannon 10-23-1960 61 y.o. male  MRN: [id number]  Admission Date: 10/01/2021  Discharge Date: 10/08/2021   Physician: Victorino Sparrow, MD  Admission Diagnosis: AAA (abdominal aortic aneurysm) (HCC) [I71.40] Aortic dissection (HCC) [I71.00]  Discharge Day services:   None   Hospital Course:  The patient was admitted to the hospital and taken to the operating room on 10/01/2021 and underwent:  Left subclavian artery transposition, Ultrasound-guided micropuncture access of the right common femoral artery Intravascular aortic ultrasound, TEVAR with left subclavian artery coverage 34 x 34 x 20 cm Cook Zenith dissection endovascular stent 46 x 120 mm by Dr. Karin Lieu and Dr. Randie Heinz.     The pt tolerated the procedure well and was transported to the PACU in stable condition. Post operatively he continued to do well but was monitored in ICU overnight. Palpable pulses in all extremities. Incision intact and well appearing. Serosanguinous drainage from JP drain.   POD#1, did well overnight. Pain overall well controlled. Clear output from JP drainage. Stable for transfer to floor  By post operative day #2, remained overall stable. having some chest pain with inspiration. Encouraged Incentive spirometry. Jp now with cloudy chyle output. Otherwise incision well appearing. Vital signs stable. Started on Octreotide and non fat liquid diet. Cleared to mobilize without restrictions. Had several episodes of vomiting later in day.   POD#3, had overnight  continued vomiting and abdominal pain. Also slightly hypotensive overnight. Improved with fluid bolus.  Passing flatus but no bowel movement since admission. Bowel regimen increased. Jp drain now longer with cloudy output -Serosanguineous.  Continued liquid non fat diet with transition to non fat diet. CT of abdomen ordered. CT later reviewed with patent visceral vessels and no concerning acute findings. Mesenteric duplex  also ordered to ensure no dynamic component to the infrarenal dissection flap. No peritonitis. No guarding on exam. Abdominal pain improved later on in day with cold compresses. Octreotide known for causing abdominal pain as well so this was held.   POD#5, better overall. Abdominal pain still intermittent but had small BM so some improvement following that. Nausea and vomiting resolved. Tolerating diet. Advanced to low fat diet. JP drain with minimal output. Continued aggressive bowel regimen. Encouraged mobilization.   The remainder of the hospital course consisted of increasing mobilization and increasing intake of solids without difficulty. Abdominal pain improved. Both mesenteric duplex and CT did not show any dynamic component of infrarenal dissection flaps. Incision in left neck remained intact and well appearing. JP drain was pulled. His extremities remained well perfused and warm.  He remained stable for discharge home post operative day 7. He will resume home medications as prescribed. Prescription for Aspirin 81 mg sent to patients pharmacy. Patient can take Tylenol as needed for post operative pain. No narcotic pain medication was sent to patients pharmacy. He will have follow up CTA Chest/ Abdomen/ Pelvis in 3-4 weeks and see Dr. Karin Lieu in the office.   CBC    Component Value Date/Time   WBC 9.3 10/06/2021 0642   RBC 3.19 (L) 10/06/2021 0642   HGB 10.4 (L) 10/06/2021 0642   HCT 29.7 (L) 10/06/2021 0642   PLT 184 10/06/2021 0642   MCV 93.1 10/06/2021 0642   MCH 32.6 10/06/2021 0642   MCHC 35.0 10/06/2021 0642   RDW 12.3 10/06/2021 0642   LYMPHSABS 2.6 10/14/2013 1719   MONOABS 0.6 10/14/2013 1719   EOSABS 0.0 10/14/2013 1719   BASOSABS 0.0 10/14/2013 1719    BMET  Component Value Date/Time   NA 136 10/06/2021 0642   NA 145 (H) 07/01/2021 1113   K 3.0 (L) 10/06/2021 0642   CL 95 (L) 10/06/2021 0642   CO2 30 10/06/2021 0642   GLUCOSE 116 (H) 10/06/2021 0642   BUN 9  10/06/2021 0642   BUN 13 07/01/2021 1113   CREATININE 0.99 10/06/2021 0642   CALCIUM 8.4 (L) 10/06/2021 0642   GFRNONAA >60 10/06/2021 0642   GFRAA >60 06/20/2017 1502       Discharge Instructions     Call MD for:  redness, tenderness, or signs of infection (pain, swelling, redness, odor or green/yellow discharge around incision site)   Complete by: As directed    Call MD for:  severe uncontrolled pain   Complete by: As directed    Call MD for:  temperature >100.4   Complete by: As directed    Diet - low sodium heart healthy   Complete by: As directed    Discharge patient   Complete by: As directed    Discharge disposition: 01-Home or Self Care   Discharge patient date: 10/08/2021   Discharge wound care:   Complete by: As directed    Clean incision daily with mild soap and water, pat dry. Do not soak in bathtub   Increase activity slowly   Complete by: As directed    Lifting restrictions   Complete by: As directed    No heavy lifting, pushing, pulling > 10 lbs for 4 weeks       Discharge Diagnosis:  AAA (abdominal aortic aneurysm) (HCC) [I71.40] Aortic dissection (HCC) [I71.00]  Secondary Diagnosis: Patient Active Problem List   Diagnosis Date Noted   AAA (abdominal aortic aneurysm) (HCC) 10/01/2021   Aortic dissection (HCC) 10/01/2021   Hypomagnesemia 06/10/2021   Abdominal cramps 06/10/2021   Constipation 06/10/2021   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   Essential hypertension 06/06/2021   Hyponatremia 06/06/2021   Hypokalemia 06/06/2021   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   Aortic dissection, abdominal (HCC) 06/06/2021   Thoracic aortic dissection (HCC) 06/06/2021   Past Medical History:  Diagnosis Date   Arthritis    Back pain    GERD (gastroesophageal reflux disease)    Hypertension      Allergies as of 10/08/2021   No Known Allergies      Medication List     STOP taking these medications     HYDROcodone-acetaminophen 5-325 MG tablet Commonly known as: NORCO/VICODIN   pantoprazole 40 MG tablet Commonly known as: Protonix   polyethylene glycol powder 17 GM/SCOOP powder Commonly known as: GLYCOLAX/MIRALAX       TAKE these medications    acetaminophen 325 MG tablet Commonly known as: TYLENOL Take 650 mg by mouth every 6 (six) hours as needed for mild pain.   amLODipine 10 MG tablet Commonly known as: NORVASC Take 1 tablet (10 mg total) by mouth daily.   aspirin EC 81 MG tablet Take 1 tablet (81 mg total) by mouth daily at 6 (six) AM. Swallow whole. Start taking on: October 09, 2021   atorvastatin 20 MG tablet Commonly known as: LIPITOR Take 1 tablet (20 mg total) by mouth daily.   carvedilol 25 MG tablet Commonly known as: COREG Take 1 tablet (25 mg total) by mouth 2 (two) times daily with a meal.   hydrochlorothiazide 12.5 MG tablet Commonly known as: HYDRODIURIL Take 1 tablet (12.5 mg total) by mouth daily.   lisinopril 40 MG tablet Commonly known as: ZESTRIL  Take 1 tablet (40 mg total) by mouth daily.   multivitamin with minerals Tabs tablet Take 1 tablet by mouth daily.               Discharge Care Instructions  (From admission, onward)           Start     Ordered   10/08/21 0000  Discharge wound care:       Comments: Clean incision daily with mild soap and water, pat dry. Do not soak in bathtub   10/08/21 0750            Discharge Instructions:   [facility]  Discharge Instructions Endovascular Aortic Aneurysm Repair  Please refer to the following instructions for your post-procedure care. Your surgeon or Physician Assistant will discuss any changes with you.  Activity  You are encouraged to walk as much as you can. You can slowly return to normal activities but must avoid strenuous activity and heavy lifting until your doctor tells you it's OK. Avoid activities such as vacuuming or swinging  a gold club. It is normal to feel tired for several weeks after your surgery. Do not drive until your doctor gives the OK and you are no longer taking prescription pain medications. It is also normal to have difficulty with sleep habits, eating, and bowel movements after surgery. These will go away with time.  Bathing/Showering  You may shower after you go home. If you have an incision, do not soak in a bathtub, hot tub, or swim until the incision heals completely.  Incision Care  Shower every day. Clean your incision with mild soap and water. Pat the area dry with a clean towel. You do not need a bandage unless otherwise instructed. Do not apply any ointments or creams to your incision. If you clothing is irritating, you may cover your incision with a dry gauze pad.  Diet  Resume your normal diet. There are no special food restrictions following this procedure. A low fat/low cholesterol diet is recommended for all patients with vascular disease. In order to heal from your surgery, it is CRITICAL to get adequate nutrition. Your body requires vitamins, minerals, and protein. Vegetables are the best source of vitamins and minerals. Vegetables also provide the perfect balance of protein. Processed food has little nutritional value, so try to avoid this.  Medications  Resume taking all of your medications unless your doctor or Physician Assistnat tells you not to. If your incision is causing pain, you may take over-the-counter pain relievers such as acetaminophen (Tylenol). If you were prescribed a stronger pain medication, please be aware these medications can cause nausea and constipation. Prevent nausea by taking the medication with a snack or meal. Avoid constipation by drinking plenty of fluids and eating foods with a high amount of fiber, such as fruits, vegetables, and grains. Do not take Tylenol if you are taking prescription pain medications.   Follow up  Our office will schedule a  follow-up appointment with a C.T. scan 3-4 weeks after your surgery.  Please call us immediately for any of the following conditions  Severe or worsening pain in your legs or feet or in your abdomen back or chest. Increased pain, redness, drainage (pus) from your incision sit. Increased abdominal pain, bloating, nausea, vomiting or persistent diarrhea. Fever of 101 degrees or higher. Swelling in your leg (s),  Reduce your risk of vascular disease  Stop smoking. If you would like help call [facility] at  1-800-QUIT-NOW 785-719-3455) or [place] at [phone]. Manage your cholesterol Maintain a desired weight Control your diabetes Keep your blood pressure down  If you have questions, please call the office at [phone].    Prescriptions given: Aspirin 81 mg #30 12 Refills  Disposition: Home  Patient's condition: is Good  Follow up: 1. Dr. Karin Lieu in 4 weeks with CTA protocol   Graceann Congress, PA-C [facility] [phone] 10/08/2021  9:27 AM   - For VQI Registry use - Post-op:  Time to Extubation: [X]  In OR, [ ]  < 12 hrs, [ ]  12-24 hrs, [ ]  >=24 hrs Vasopressors Req. Post-op: No MI: No., [ ]  Troponin only, [ ]  EKG or Clinical New Arrhythmia: No CHF: No ICU Stay: 2 days Transfusion: No     If yes, 0 units given  Complications: Resp failure: No., [ ]  Pneumonia, [ ]  Ventilator Chg in renal function: No., [ ]  Inc. Cr > 0.5, [ ]  Temp. Dialysis,  [ ]  Permanent dialysis Leg ischemia: No., no Surgery needed, [ ]  Yes, Surgery needed,  [ ]  Amputation Bowel ischemia: No., [ ]  Medical Rx, [ ]  Surgical Rx Wound complication: No., [ ]  Superficial separation/infection, [ ]  Return to OR Return to OR: No  Return to OR for bleeding: No Stroke: No., [ ]  Minor, [ ]  Major  Discharge medications: Statin use:  Yes  ASA use:  Yes  Plavix use:  No  Beta blocker use:  Yes  ARB use:  No ACEI use:  Yes CCB use:  Yes

## 2021-10-08 NOTE — Progress Notes (Signed)
  Progress Note    10/08/2021 8:48 AM 7 Days Post-Op  Subjective:  Feels good, ready to go home, but notes some anxiety   Vitals:   10/08/21 0009 10/08/21 0345  BP: (!) 111/56 (!) 116/57  Pulse: 84 95  Resp: 18 17  Temp: 98.2 F (36.8 C) 98.4 F (36.9 C)  SpO2: 95% 97%   Physical Exam: Cardiac:  regular Lungs:  non labored Incisions: Healing well.   Extremities:  well perfused and warm. Palpable 2+ L radial pulse. 2+ bilateral Dp pulses  Neurologic: alert and oriented  CBC    Component Value Date/Time   WBC 9.3 10/06/2021 0642   RBC 3.19 (L) 10/06/2021 0642   HGB 10.4 (L) 10/06/2021 0642   HCT 29.7 (L) 10/06/2021 0642   PLT 184 10/06/2021 0642   MCV 93.1 10/06/2021 0642   MCH 32.6 10/06/2021 0642   MCHC 35.0 10/06/2021 0642   RDW 12.3 10/06/2021 0642   LYMPHSABS 2.6 10/14/2013 1719   MONOABS 0.6 10/14/2013 1719   EOSABS 0.0 10/14/2013 1719   BASOSABS 0.0 10/14/2013 1719    BMET    Component Value Date/Time   NA 136 10/06/2021 0642   NA 145 (H) 07/01/2021 1113   K 3.0 (L) 10/06/2021 0642   CL 95 (L) 10/06/2021 0642   CO2 30 10/06/2021 0642   GLUCOSE 116 (H) 10/06/2021 0642   BUN 9 10/06/2021 0642   BUN 13 07/01/2021 1113   CREATININE 0.99 10/06/2021 0642   CALCIUM 8.4 (L) 10/06/2021 0642   GFRNONAA >60 10/06/2021 0642   GFRAA >60 06/20/2017 1502    INR    Component Value Date/Time   INR 1.2 10/01/2021 1314    No intake or output data in the 24 hours ending 10/08/21 0848    Assessment/Plan:  61 y.o. male is s/p  Left subclavian artery transposition Ultrasound-guided micropuncture access of the right common femoral artery Intravascular aortic ultrasound TEVAR-with left subclavian artery coverage 34 x 34 x 20 cm Cook Zenith dissection endovascular stent 46x116mm 7 Days Post-Op   Home today  Victorino Sparrow, [facility] [phone] 10/08/2021 8:48 AM

## 2021-10-13 ENCOUNTER — Telehealth: Payer: Self-pay

## 2021-10-13 NOTE — Telephone Encounter (Signed)
Pt called stating that he was having complications with his bowels after surgery.  Reviewed pt's chart, returned pt's call, two identifiers used. Pt reports doing very well since surgery except his bowel movements. Pt's last BM was Sunday, 7/30. Pt given constipation relief measures. Confirmed understanding.

## 2021-10-26 ENCOUNTER — Other Ambulatory Visit: Payer: Self-pay

## 2021-10-26 DIAGNOSIS — I71019 Dissection of thoracic aorta, unspecified: Secondary | ICD-10-CM

## 2021-10-27 ENCOUNTER — Telehealth: Payer: Self-pay

## 2021-10-27 NOTE — Telephone Encounter (Signed)
Pt called after hours line this morning at 0742 stating that his mouth and face was swollen and he was having major stomach issues, and he was heading to [facility] now.  Pt called office again at 0922, transferred to triage line. Left msg stating that he was going to ED. Reviewed pt's chart and it showed that he was currently admitted in ED.  Pt came to office, stating that he could not wait 19 hours in ED to be seen. Darl Pikes asked me to talk to pt. Went to waiting room, asked pt to follow me to a private area in the clinic, and saw that his upper lip was severely swollen and he was doubled over with stomach pain. Instructed him to return to ED to which he adamantly objected saying he could not sit there for 19 hrs waiting, he would "just die". Instructed pt that he needed to be seen because the swelling of his lips could easily and quickly radiate to tongue and throat, leading to difficulty breathing. Offered him the option of going to a different ED with less wait time or urgent care. He stated he would go and get evaluated right away. Confirmed understanding.

## 2021-10-28 ENCOUNTER — Other Ambulatory Visit: Payer: Self-pay

## 2021-10-28 DIAGNOSIS — I71019 Dissection of thoracic aorta, unspecified: Secondary | ICD-10-CM

## 2021-10-29 ENCOUNTER — Ambulatory Visit (HOSPITAL_COMMUNITY)
Admission: RE | Admit: 2021-10-29 | Discharge: 2021-10-29 | Disposition: A | Discharge status: Home / Self Care | Payer: No Typology Code available for payment source | Source: Ambulatory Visit | Attending: Vascular Surgery | Admitting: Vascular Surgery

## 2021-10-29 DIAGNOSIS — I71019 Dissection of thoracic aorta, unspecified: Secondary | ICD-10-CM | POA: Insufficient documentation

## 2021-10-29 MED ORDER — IOHEXOL 350 MG/ML SOLN
100.0000 mL | Freq: Once | INTRAVENOUS | Status: AC | PRN
  Administered 2021-10-29: 100 mL via INTRAVENOUS

## 2021-10-30 ENCOUNTER — Encounter: Payer: Self-pay | Admitting: Vascular Surgery

## 2021-10-30 ENCOUNTER — Telehealth: Payer: Self-pay | Admitting: Internal Medicine

## 2021-10-30 ENCOUNTER — Ambulatory Visit (INDEPENDENT_AMBULATORY_CARE_PROVIDER_SITE_OTHER): Payer: No Typology Code available for payment source | Admitting: Vascular Surgery

## 2021-10-30 VITALS — BP 116/73 | HR 98 | Temp 98.0°F | Resp 20 | Ht 73.0 in | Wt 152.0 lb

## 2021-10-30 DIAGNOSIS — I71012 Dissection of descending thoracic aorta: Secondary | ICD-10-CM

## 2021-10-30 MED ORDER — CARVEDILOL 12.5 MG PO TABS: 12.5000 mg | ORAL_TABLET | Freq: Two times a day (BID) | ORAL | 3 refills | Status: DC

## 2021-10-30 NOTE — Telephone Encounter (Signed)
Spoke with Dr. Karin Lieu regarding Brian Cannon post-op course. He has had signs of orthostasis as well as low Bps. After discussion with Brian Cannon, will plan to stop his lisinopril, and carvedilol. Will also plan to reduce his norvasc to 5 mg daily and can continue his Hctz.

## 2021-10-30 NOTE — Progress Notes (Signed)
Office Note     HPI: Brian Cannon is a 61 y.o. (January 28, 1961) male presenting in follow up s/p left subclavian transposition, TEVAR for type B aortic dissection.  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  Case was complicated by small chyle leak that resolved with conservative treatment.    Exam today, Brian Cannon was accompanied by his daughter.  Over the last month, he has struggled with lack of appetite, poor energy level.  He states when he stands, he feels like he is going to pass out.  He also notes dizziness and intermittent abdominal pain.  Brian Cannon has had weight loss since surgery due to poor intake.  Brian Cannon believes some of this could be from anxiety but also thinks the dizziness could be from his antihypertensive regimen.    The pt is  on a statin for cholesterol management.  The pt is  on a daily aspirin.   Other AC:  - The pt is  on medication for hypertension.   The pt is not diabetic.  Tobacco hx:  -  Past Medical History:  Diagnosis Date   Arthritis    Back pain    GERD (gastroesophageal reflux disease)    Hypertension     Past Surgical History:  Procedure Laterality Date   CAROTID-SUBCLAVIAN BYPASS GRAFT Left 10/01/2021   Procedure: BYPASS GRAFT LEFT CAROTID-SUBCLAVIAN;  Surgeon: Victorino Sparrow, MD;  Location: [facility] OR;  Service: Vascular;  Laterality: Left;   THORACIC AORTIC ENDOVASCULAR STENT GRAFT N/A 10/01/2021   Procedure: THORACIC AORTIC ENDOVASCULAR STENT GRAFT;  Surgeon: Victorino Sparrow, MD;  Location: [facility] OR;  Service: Vascular;  Laterality: N/A;   ULTRASOUND GUIDANCE FOR VASCULAR ACCESS Right 10/01/2021   Procedure: ULTRASOUND GUIDANCE FOR VASCULAR ACCESS, RIGHT FEMORAL ARTERY;  Surgeon: Victorino Sparrow, MD;  Location: [facility] OR;  Service: Vascular;  Laterality: Right;    Social History   Socioeconomic History   Marital status: Single    Spouse  name: Not on file   Number of children: Not on file   Years of education: Not on file   Highest education level: Not on file  Occupational History   Not on file  Tobacco Use   Smoking status: Former    Packs/day: 1.00    Types: Cigarettes    Passive exposure: Past   Smokeless tobacco: Never  Vaping Use   Vaping Use: Never used  Substance and Sexual Activity   Alcohol use: Yes    Alcohol/week: 1.0 standard drink of alcohol    Types: 1 Cans of beer per week   Drug use: No   Sexual activity: Yes  Other Topics Concern   Not on file  Social History Narrative   Not on file   Social Determinants of Health   Financial Resource Strain: Not on file  Food Insecurity: Not on file  Transportation Needs: Not on file  Physical Activity: Not on file  Stress: Not on file  Social Connections: Not on file  Intimate Partner Violence: Not on file   Family History  Problem Relation Age of Onset   Heart attack Mother    Prostate cancer Father     Current Outpatient Medications  Medication Sig Dispense Refill   acetaminophen (TYLENOL) 325 MG tablet Take 650 mg by mouth every 6 (six) hours as needed for mild pain.     amLODipine (NORVASC) 10 MG tablet Take 1  tablet (10 mg total) by mouth daily. 90 tablet 3   aspirin EC 81 MG tablet Take 1 tablet (81 mg total) by mouth daily at 6 (six) AM. Swallow whole. 30 tablet 12   atorvastatin (LIPITOR) 20 MG tablet Take 1 tablet (20 mg total) by mouth daily. 90 tablet 3   carvedilol (COREG) 25 MG tablet Take 1 tablet (25 mg total) by mouth 2 (two) times daily with a meal. 180 tablet 1   hydrochlorothiazide (HYDRODIURIL) 12.5 MG tablet Take 1 tablet (12.5 mg total) by mouth daily. 90 tablet 3   lisinopril (ZESTRIL) 40 MG tablet Take 1 tablet (40 mg total) by mouth daily. 90 tablet 3   Multiple Vitamin (MULTIVITAMIN WITH MINERALS) TABS tablet Take 1 tablet by mouth daily.     No current facility-administered medications for this visit.    No Known  Allergies   REVIEW OF SYSTEMS:  [X]  denotes positive finding, [ ]  denotes negative finding Cardiac  Comments:  Chest pain or chest pressure:    Shortness of breath upon exertion:    Short of breath when lying flat:    Irregular heart rhythm:        Vascular    Pain in calf, thigh, or hip brought on by ambulation: X   Pain in feet at night that wakes you up from your sleep:     Blood clot in your veins:    Leg swelling:         Pulmonary    Oxygen at home:    Productive cough:     Wheezing:         Neurologic    Sudden weakness in arms or legs:     Sudden numbness in arms or legs:     Sudden onset of difficulty speaking or slurred speech:    Temporary loss of vision in one eye:     Problems with dizziness:         Gastrointestinal    Blood in stool:     Vomited blood:         Genitourinary    Burning when urinating:     Blood in urine:        Psychiatric    Major depression:         Hematologic    Bleeding problems:    Problems with blood clotting too easily:        Skin    Rashes or ulcers:        Constitutional    Fever or chills:      PHYSICAL EXAMINATION:  Vitals:   10/30/21 1234  BP: 116/73  Pulse: 98  Resp: 20  Temp: 98 F (36.7 C)  SpO2: 97%  Weight: 152 lb (68.9 kg)  Height: 6\' 1"  (1.854 m)    General:  WDWN in NAD; vital signs documented above Gait: Not observed HENT: WNL, normocephalic Pulmonary: normal non-labored breathing , without wheezing Cardiac: regular HR Abdomen: soft, NT, no masses Skin: without rashes Vascular Exam/Pulses:  Right Left  Radial 2+ (normal) 2+ (normal)  Ulnar 2+ (normal) 2+ (normal)  Femoral 1+ (weak) 1+ (weak)  Popliteal    DP absent absent  PT absent absent   Extremities: without ischemic changes, without Gangrene , without cellulitis; without open wounds;  Musculoskeletal: no muscle wasting or atrophy  Neurologic: A&O X 3;  No focal weakness or paresthesias are detected Psychiatric:  The pt has  Normal affect.   Non-Invasive Vascular Imaging:  ASSESSMENT/PLAN: Brian Cannon is a 61 y.o. 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   Patient was reviewed demonstrating slight interval increase by 3 mm.  I disagree with radiology's measurements which cited a larger size.  I am surprised how poorly Patton is feeling.  Imaging demonstrates widely patent visceral aorta.  He denies symptoms of claudication.  The lack of energy, poor appetite, dizziness, presyncopal episodes, are concerning for orthostatic hypotension related to his antihypertensive regimen.  I have talked to his cardiologist Dr. Wyline Mood, and would like to try him at a higher baseline-160 systolic blood pressure.  Regarding his intermittent abdominal pain, there are multiple etiologies.  I do not think any of these are aortic based.  I have plan to refer him to gastroenterology for possible scope as symptoms are concerning for ulceration.  We discussed the importance of high caloric intake for weight gain.  Being that he had some interval growth, my plan is to see him in 3 months with repeat imaging studies.  Ollin was asked to call my office should any questions or concerns arise.   Victorino Sparrow, MD [facility] [phone]

## 2021-10-30 NOTE — Telephone Encounter (Addendum)
Called and spoke to  patient regarding his medications. Patient states that he was experiencing swelling of his face, and lips and states that he stopped taking his Lisinopril and his Amlodipine and reports that his swelling is better today. Last dose of these medications was this past Monday 10/26/21- patient was seen today by vascular and HR was 98 bpm, and BP of 116/73.   Spoke with Dr. Wyline Mood who states patient can decrease carvedilol to 12.5mg  twice daily and continue on HCTZ. Medication changes made in patient chart, and patient is aware of all instructions and verbalized understanding.   Lisinopril added to patients allergy list in chart.

## 2021-10-30 NOTE — Addendum Note (Signed)
Addended by: Bea Laura B on: 10/30/2021 04:00 PM   Modules accepted: Orders

## 2022-01-13 ENCOUNTER — Encounter: Payer: Self-pay | Admitting: Internal Medicine

## 2022-01-13 ENCOUNTER — Ambulatory Visit: Payer: No Typology Code available for payment source | Admitting: Internal Medicine

## 2022-01-13 ENCOUNTER — Ambulatory Visit: Payer: No Typology Code available for payment source | Attending: Internal Medicine | Admitting: Internal Medicine

## 2022-01-13 VITALS — BP 124/70 | HR 77 | Ht 74.0 in | Wt 170.6 lb

## 2022-01-13 DIAGNOSIS — I10 Essential (primary) hypertension: Secondary | ICD-10-CM

## 2022-01-13 MED ORDER — ATORVASTATIN CALCIUM 40 MG PO TABS: 40.0000 mg | ORAL_TABLET | Freq: Every day | ORAL | 3 refills | Status: DC

## 2022-01-13 NOTE — Progress Notes (Signed)
Cardiology Office Note:    Date:  01/13/2022   ID:  [name], DOB [birth year], MRN [id number]  PCP:  Verlon Au, MD   [facility] Providers Cardiologist:  Maisie Fus, MD     Referring MD: Verlon Au, MD   No chief complaint on file. Aortic Dissection  History of Present Illness:    Brian Cannon is a 61 y.o. 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 Was managed on esmolol drip which was weaned. Bps were in a good range on discharge. Today he notes some chest discomfort in the left chest. He notes with laying flat. Not associated with activity. Peppermint improves it. Since he went into the hospital.  He notes some deconditioning with walking. He typically goes to the gym and does a lot of walking at his job. He can do > 4 mets without cp or sob  He is planned for CT angio 07/22/2021.   He checks his blood pressures at home. Blood pressures one teens systolic, under 80 for diastolic.   No family hx of dissection. No drug use. Mother had  MI in her 52s.  Interim hx 01/13/2022 S/p TEVAR 10/01/2021. He's feeling fatigued with Bp meds  Recently stopped lisinopril with lip swelling.  Continued on coreg 12.5 mg BID, norvasc 10 mg daily and HCTz 12.5 mg daily. Blood pressure are in good control.  Cardiology Studies: TTE 3 /26/2023- EF 50-55  The 10-year ASCVD risk score (Arnett DK, et al., 2019) is: 13.4%   Values used to calculate the score:     Age: 65 years     Sex: Male     Is Non-Hispanic African American: Yes     Diabetic: No     Tobacco smoker: No     Systolic Blood Pressure: 124 mmHg     Is BP treated: Yes     HDL Cholesterol: 40 mg/dL     Total Cholesterol: 149 mg/dL    CT abd/pelvis 7/82/4235   VASCULAR   Aorta: Thoracoabdominal aortic dissection extends to the  level of the aortic bifurcation. The celiac, SMA, and right renal artery arise from the true lumen. Left renal artery arises from the false lumen. Fairly symmetrical enhancement of the true and false lumen. Stable mild mural thickening of the aortic bifurcation and proximal bilateral iliac arteries, without evidence of retroperitoneal hematoma or rupture. Prominent atherosclerosis of the distal aorta at the level of bifurcation.   Celiac: Mild narrowing at the origin of the celiac artery due to the anterior margin of the dissection flap. The narrowing of the common hepatic artery seen on prior study has resolved, and may reflect spasm. No focal stenosis.   SMA: Mild narrowing at the origin the SMA due to the anterior aspect of the dissection flap, less than 50%. Remainder of the SMA is unremarkable.   Renals: Right renal is widely patent arising from the true lumen. The left renal is widely patent arising from the false lumen. No focal stenosis or renal artery dissection.   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   Inflow: Diffuse atherosclerosis throughout the bilateral common iliac arteries. Less than 50% narrowing at the origin of the right common iliac artery, with 50-70% narrowing at the origin of the left common iliac  artery. These are stable findings. Bilateral external and internal iliac arteries are widely patent.  Past Medical History:  Diagnosis Date   Arthritis    Back pain    GERD (gastroesophageal reflux disease)    Hypertension     Past Surgical History:  Procedure Laterality Date   CAROTID-SUBCLAVIAN BYPASS GRAFT Left 10/01/2021   Procedure: BYPASS GRAFT LEFT CAROTID-SUBCLAVIAN;  Surgeon: Broadus John, MD;  Location: [facility];  Service: Vascular;  Laterality: Left;   THORACIC AORTIC ENDOVASCULAR STENT GRAFT N/A 10/01/2021   Procedure: THORACIC AORTIC ENDOVASCULAR STENT GRAFT;  Surgeon: Broadus John,  MD;  Location: [facility];  Service: Vascular;  Laterality: N/A;   ULTRASOUND GUIDANCE FOR VASCULAR ACCESS Right 10/01/2021   Procedure: ULTRASOUND GUIDANCE FOR VASCULAR ACCESS, RIGHT FEMORAL ARTERY;  Surgeon: Broadus John, MD;  Location: [facility];  Service: Vascular;  Laterality: Right;    Current Medications: Current Meds  Medication Sig   acetaminophen (TYLENOL) 325 MG tablet Take 650 mg by mouth every 6 (six) hours as needed for mild pain.   aspirin EC 81 MG tablet Take 1 tablet (81 mg total) by mouth daily at 6 (six) AM. Swallow whole.   carvedilol (COREG) 12.5 MG tablet Take 1 tablet (12.5 mg total) by mouth 2 (two) times daily with a meal.   hydrochlorothiazide (HYDRODIURIL) 12.5 MG tablet Take 1 tablet (12.5 mg total) by mouth daily.   Multiple Vitamin (MULTIVITAMIN WITH MINERALS) TABS tablet Take 1 tablet by mouth daily.   [DISCONTINUED] atorvastatin (LIPITOR) 20 MG tablet Take 1 tablet (20 mg total) by mouth daily.     Allergies:   Lisinopril   Social History   Socioeconomic History   Marital status: Single    Spouse name: Not on file   Number of children: Not on file   Years of education: Not on file   Highest education level: Not on file  Occupational History   Not on file  Tobacco Use   Smoking status: Former    Packs/day: 1.00    Types: Cigarettes    Passive exposure: Past   Smokeless tobacco: Never  Vaping Use   Vaping Use: Never used  Substance and Sexual Activity   Alcohol use: Yes    Alcohol/week: 1.0 standard drink of alcohol    Types: 1 Cans of beer per week   Drug use: No   Sexual activity: Yes  Other Topics Concern   Not on file  Social History Narrative   Not on file   Social Determinants of Health   Financial Resource Strain: Not on file  Food Insecurity: Not on file  Transportation Needs: Not on file  Physical Activity: Not on file  Stress: Not on file  Social Connections: Not on file     Family History: The patient's family history  includes Heart attack in his mother; Prostate cancer in his father.  ROS:   Please see the history of present illness.     All other systems reviewed and are negative.  EKGs/Labs/Other Studies Reviewed:    The following studies were reviewed today:   EKG:  EKG is  ordered today.  The ekg ordered today demonstrates   07/09/2021-NSR  01/13/2022- NSR  Recent Labs: 10/01/2021: Magnesium 1.9 10/04/2021: ALT 12 10/06/2021: BUN 9; Creatinine, Ser 0.99; Hemoglobin 10.4; Platelets 184; Potassium 3.0; Sodium 136   Recent Lipid Panel    Component Value Date/Time   CHOL 196 07/09/2021 0957   TRIG 121 07/09/2021 0957   HDL  55 07/09/2021 0957   CHOLHDL 3.6 07/09/2021 0957   LDLCALC 119 (H) 07/09/2021 0957     Risk Assessment/Calculations:           Physical Exam:    VS:   Vitals:   01/13/22 0953  BP: 124/70  Pulse: 77  SpO2: 97%    Wt Readings from Last 3 Encounters:  01/13/22 170 lb 9.6 oz (77.4 kg)  10/30/21 152 lb (68.9 kg)  10/01/21 173 lb (78.5 kg)     GEN:  Well nourished, well developed in no acute distress HEENT: Normal NECK: No JVD; No carotid bruits LYMPHATICS: No lymphadenopathy CARDIAC: RRR, no murmurs, rubs, gallops RESPIRATORY:  Clear to auscultation without rales, wheezing or rhonchi  ABDOMEN: Soft, non-tender, non-distended MUSCULOSKELETAL:  No edema; No deformity  SKIN: Warm and dry NEUROLOGIC:  Alert and oriented x 3 PSYCHIATRIC:  Normal affect   ASSESSMENT:    Type B dissection: S/p TEVAR. Stable. Goal is BP management  HTN: taking norvasc 10 mg daily, carvedilol 12.5 mg BID and Hctz 12.5 mg daily. BP's are well controlled. He is not tolerating meds very well, can try to taper them down  HLD: increase atorvastatin 20 to 40 mg daily. LDL goal <70 mg/dL  PLAN:    In order of problems listed above:  Increased lipitor Follow up in 12 months         Medication Adjustments/Labs and Tests Ordered: Current medicines are reviewed at length  with the patient today.  Concerns regarding medicines are outlined above.  Orders Placed This Encounter  Procedures   EKG 12-Lead   Meds ordered this encounter  Medications   atorvastatin (LIPITOR) 40 MG tablet    Sig: Take 1 tablet (40 mg total) by mouth daily.    Dispense:  90 tablet    Refill:  3    Patient Instructions  Medication Instructions:  INCREASE ATORVASTATIN TO 40mg  ONCE DAILY  *If you need a refill on your cardiac medications before your next appointment, please call your pharmacy*  Lab Work: None Ordered At This Time.  If you have labs (blood work) drawn today and your tests are completely normal, you will receive your results only by: MyChart Message (if you have MyChart) OR A paper copy in the mail If you have any lab test that is abnormal or we need to change your treatment, we will call you to review the results.  Testing/Procedures: None Ordered At This Time.   Follow-Up: At [facility], you and your health needs are our priority.  As part of our continuing mission to provide you with exceptional heart care, we have created designated Provider Care Teams.  These Care Teams include your primary Cardiologist (physician) and Advanced Practice Providers (APPs -  Physician Assistants and Nurse Practitioners) who all work together to provide you with the care you need, when you need it.  Your next appointment:   1 year(s)  The format for your next appointment:   In Person  Provider:   [facility], MD          Signed, Maisie Fus, MD  01/13/2022 10:42 AM    [facility]

## 2022-01-13 NOTE — Patient Instructions (Signed)
Medication Instructions:  INCREASE ATORVASTATIN TO 40mg  ONCE DAILY  *If you need a refill on your cardiac medications before your next appointment, please call your pharmacy*  Lab Work: None Ordered At This Time.  If you have labs (blood work) drawn today and your tests are completely normal, you will receive your results only by: [place] (if you have MyChart) OR A paper copy in the mail If you have any lab test that is abnormal or we need to change your treatment, we will call you to review the results.  Testing/Procedures: None Ordered At This Time.   Follow-Up: At [facility], you and your health needs are our priority.  As part of our continuing mission to provide you with exceptional heart care, we have created designated Provider Care Teams.  These Care Teams include your primary Cardiologist (physician) and Advanced Practice Providers (APPs -  Physician Assistants and Nurse Practitioners) who all work together to provide you with the care you need, when you need it.  Your next appointment:   1 year(s)  The format for your next appointment:   In Person  Provider:   Janina Mayo, MD

## 2022-01-13 NOTE — Progress Notes (Deleted)
Cardiology Office Note:    Date:  01/13/2022   ID:  Brian Cannon, DOB November 16, 1960, MRN [id number]  PCP:  Verlon Au, MD   [facility] Providers Cardiologist:  Maisie Fus, MD     Referring MD: Verlon Au, MD   No chief complaint on file. Aortic Dissection  History of Present Illness:    Brian Cannon is a 62 y.o. 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 Was managed on esmolol drip which was weaned. Bps were in a good range on discharge. Today he notes some chest discomfort in the left chest. He notes with laying flat. Not associated with activity. Peppermint improves it. Since he went into the hospital.  He notes some deconditioning with walking. He typically goes to the gym and does a lot of walking at his job. He can do > 4 mets without cp or sob  He is planned for CT angio 07/22/2021.   He checks his blood pressures at home. Blood pressures one teens systolic, under 80 for diastolic.   No family hx of dissection. No drug use. Mother had  MI in her 4s.  Interim hx 01/13/2022 S/p TEVAR 10/01/2021  Recently stopped lisinopril and carvedilol with low Bps. Continued on norvasc 5 mg daily.  Cardiology Studies: TTE 3 /26/2023- EF 50-55  CT abd/pelvis 06/08/2021   VASCULAR   Aorta: Thoracoabdominal aortic dissection extends to the level of the aortic bifurcation. The celiac, SMA, and right renal artery arise from the true lumen. Left renal artery arises from the false lumen. Fairly symmetrical enhancement of the true and false lumen. Stable mild mural thickening of the aortic bifurcation and proximal bilateral iliac arteries, without evidence of retroperitoneal hematoma or rupture. Prominent atherosclerosis of the distal aorta at the level of bifurcation.   Celiac: Mild  narrowing at the origin of the celiac artery due to the anterior margin of the dissection flap. The narrowing of the common hepatic artery seen on prior study has resolved, and may reflect spasm. No focal stenosis.   SMA: Mild narrowing at the origin the SMA due to the anterior aspect of the dissection flap, less than 50%. Remainder of the SMA is unremarkable.   Renals: Right renal is widely patent arising from the true lumen. The left renal is widely patent arising from the false lumen. No focal stenosis or renal artery dissection.   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   Inflow: Diffuse atherosclerosis throughout the bilateral common iliac arteries. Less than 50% narrowing at the origin of the right common iliac artery, with 50-70% narrowing at the origin of the left common iliac artery. These are stable findings. Bilateral external and internal iliac arteries are widely patent.  Past Medical History:  Diagnosis Date   Arthritis    Back pain    GERD (gastroesophageal reflux disease)    Hypertension     Past Surgical History:  Procedure Laterality Date   CAROTID-SUBCLAVIAN BYPASS GRAFT Left 10/01/2021   Procedure: BYPASS GRAFT LEFT CAROTID-SUBCLAVIAN;  Surgeon: Victorino Sparrow, MD;  Location: [facility] OR;  Service: Vascular;  Laterality: Left;   THORACIC AORTIC ENDOVASCULAR STENT GRAFT N/A 10/01/2021   Procedure: THORACIC AORTIC ENDOVASCULAR STENT GRAFT;  Surgeon: Victorino Sparrow, MD;  Location: [facility] OR;  Service: Vascular;  Laterality: N/A;   ULTRASOUND GUIDANCE FOR VASCULAR ACCESS Right 10/01/2021   Procedure: ULTRASOUND GUIDANCE FOR VASCULAR ACCESS, RIGHT FEMORAL ARTERY;  Surgeon: Broadus John, MD;  Location: [facility] OR;  Service: Vascular;  Laterality: Right;    Current Medications: No outpatient medications have been marked as taking for the 01/13/22 encounter (Appointment) with Janina Mayo, MD.     Allergies:    Lisinopril   Social History   Socioeconomic History   Marital status: Single    Spouse name: Not on file   Number of children: Not on file   Years of education: Not on file   Highest education level: Not on file  Occupational History   Not on file  Tobacco Use   Smoking status: Former    Packs/day: 1.00    Types: Cigarettes    Passive exposure: Past   Smokeless tobacco: Never  Vaping Use   Vaping Use: Never used  Substance and Sexual Activity   Alcohol use: Yes    Alcohol/week: 1.0 standard drink of alcohol    Types: 1 Cans of beer per week   Drug use: No   Sexual activity: Yes  Other Topics Concern   Not on file  Social History Narrative   Not on file   Social Determinants of Health   Financial Resource Strain: Not on file  Food Insecurity: Not on file  Transportation Needs: Not on file  Physical Activity: Not on file  Stress: Not on file  Social Connections: Not on file     Family History: The patient's family history includes Heart attack in his mother; Prostate cancer in his father.  ROS:   Please see the history of present illness.     All other systems reviewed and are negative.  EKGs/Labs/Other Studies Reviewed:    The following studies were reviewed today:   EKG:  EKG is  ordered today.  The ekg ordered today demonstrates   07/09/2021-NSR  Recent Labs: 10/01/2021: Magnesium 1.9 10/04/2021: ALT 12 10/06/2021: BUN 9; Creatinine, Ser 0.99; Hemoglobin 10.4; Platelets 184; Potassium 3.0; Sodium 136  Recent Lipid Panel    Component Value Date/Time   CHOL 196 07/09/2021 0957   TRIG 121 07/09/2021 0957   HDL 55 07/09/2021 0957   CHOLHDL 3.6 07/09/2021 0957   LDLCALC 119 (H) 07/09/2021 0957     Risk Assessment/Calculations:           Physical Exam:    VS:    **** Wt Readings from Last 3 Encounters:  10/30/21 152 lb (68.9 kg)  10/01/21 173 lb (78.5 kg)  09/28/21 172 lb 1.6 oz (78.1 kg)     GEN:  Well nourished, well developed in no  acute distress HEENT: Normal NECK: No JVD; No carotid bruits LYMPHATICS: No lymphadenopathy CARDIAC: RRR, no murmurs, rubs, gallops RESPIRATORY:  Clear to auscultation without rales, wheezing or rhonchi  ABDOMEN: Soft, non-tender, non-distended MUSCULOSKELETAL:  No edema; No deformity  SKIN: Warm and dry NEUROLOGIC:  Alert and oriented x 3 PSYCHIATRIC:  Normal affect   ASSESSMENT:    Type B dissection: Chronic. BP goal closer to 120 mmHg. Continue coreg 25 mg BID, norvasc 10 mg daily, and lisinopril 40 mg daily.  Can increase HCTz to 25 mg daily if Bps rise above 120/80 mmHg. Recommend continuing to follow with vascular sx for surveillance. Discussed with him that if he develops cp or sob with activity to let us know for stress testing. Will continue to CVD risk mitigate. Getting lipid  profile today.   PLAN:    In order of problems listed above:  Fasting lipids Follow up in 3 months         Medication Adjustments/Labs and Tests Ordered: Current medicines are reviewed at length with the patient today.  Concerns regarding medicines are outlined above.  No orders of the defined types were placed in this encounter.  No orders of the defined types were placed in this encounter.   There are no Patient Instructions on file for this visit.   Signed, Maisie Fus, MD  01/13/2022 8:43 AM    [facility]

## 2022-03-10 ENCOUNTER — Other Ambulatory Visit: Payer: Self-pay | Admitting: Internal Medicine

## 2022-03-16 ENCOUNTER — Other Ambulatory Visit: Payer: Self-pay | Admitting: *Deleted

## 2022-03-16 ENCOUNTER — Other Ambulatory Visit: Payer: Self-pay

## 2022-03-16 DIAGNOSIS — R109 Unspecified abdominal pain: Secondary | ICD-10-CM

## 2022-03-16 DIAGNOSIS — I71019 Dissection of thoracic aorta, unspecified: Secondary | ICD-10-CM

## 2022-03-17 ENCOUNTER — Telehealth: Payer: Self-pay | Admitting: Vascular Surgery

## 2022-03-17 NOTE — Telephone Encounter (Signed)
I have tried to call pt several times regarding appt date and time for CT. Left voicemails. Daughter's number is out of service.

## 2022-03-18 ENCOUNTER — Ambulatory Visit (HOSPITAL_COMMUNITY)
Admission: RE | Admit: 2022-03-18 | Discharge: 2022-03-18 | Disposition: A | Discharge status: Home / Self Care | Payer: BLUE CROSS/BLUE SHIELD | Source: Ambulatory Visit | Attending: Vascular Surgery | Admitting: Vascular Surgery

## 2022-03-18 ENCOUNTER — Telehealth: Payer: Self-pay

## 2022-03-18 DIAGNOSIS — I71019 Dissection of thoracic aorta, unspecified: Secondary | ICD-10-CM | POA: Diagnosis not present

## 2022-03-18 MED ORDER — IOHEXOL 350 MG/ML SOLN
100.0000 mL | Freq: Once | INTRAVENOUS | Status: AC | PRN
  Administered 2022-03-18: 100 mL via INTRAVENOUS

## 2022-03-18 NOTE — Addendum Note (Signed)
Addended by: Kaleen Mask on: 03/18/2022 10:12 AM   Modules accepted: Orders

## 2022-03-18 NOTE — Telephone Encounter (Signed)
Auth is PENDING for CTA due to new insurance change. Patient changed from [place] to [facility] and no longer approved for [facility] facilities. He is approved for [facility], [facility] facilities now. Auth was submitted for possible exception due to continuation of care. Pending Auth [id number]. I tried calling patient as soon as we were aware of his coverage change.  No answer.  A voice message was left to call us back as soon as possible.

## 2022-03-19 ENCOUNTER — Encounter: Payer: Self-pay | Admitting: Vascular Surgery

## 2022-03-19 ENCOUNTER — Ambulatory Visit (INDEPENDENT_AMBULATORY_CARE_PROVIDER_SITE_OTHER): Payer: BLUE CROSS/BLUE SHIELD | Admitting: Vascular Surgery

## 2022-03-19 ENCOUNTER — Ambulatory Visit (HOSPITAL_COMMUNITY)
Admission: RE | Admit: 2022-03-19 | Discharge: 2022-03-19 | Disposition: A | Discharge status: Home / Self Care | Payer: BLUE CROSS/BLUE SHIELD | Source: Ambulatory Visit | Attending: Vascular Surgery | Admitting: Vascular Surgery

## 2022-03-19 VITALS — BP 126/79 | HR 79 | Temp 98.2°F | Resp 20 | Ht 74.0 in | Wt 170.0 lb

## 2022-03-19 DIAGNOSIS — I71012 Dissection of descending thoracic aorta: Secondary | ICD-10-CM | POA: Diagnosis not present

## 2022-03-19 DIAGNOSIS — R109 Unspecified abdominal pain: Secondary | ICD-10-CM | POA: Diagnosis not present

## 2022-03-19 NOTE — Progress Notes (Signed)
Office Note     HPI: Brian Cannon is a 62 y.o. (16-Jun-1960) male presenting in follow up s/p left subclavian transposition, TEVAR for type B aortic dissection.  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  Case was complicated by small chyle leak that resolved with conservative treatment.    He was last seen in August of this year noted to have significant weight loss, lethargy, and symptoms of orthostatic hypotension.  There were some degenerative changes noted to his aorta, however these did not meet size criteria for repair, therefore we elected to repeat CT scan in 3 months.  On exam today, Brian Cannon was accompanied by his girlfriend whom I met in the hospital at the time of his acute dissection.  She was doing well and continues to make the trip from [place] on a regular basis.  As for Brian Cannon, he has had significant weight gain and improvement in energy levels over the last several months.  Brian Cannon has a history of lower back problems, and is currently fighting for disability.  He notes pain in bilateral lower extremities after ambulating roughly 1/8 of a mile.  This resolves with rest.  He also appreciates bilateral lower extremity pain and numbness in the legs and feet when sitting in a car.  This has hindered him from making trips to [place] to see his girlfriend.  The furthest he has driven is [place].  Brian Cannon denies symptoms of ischemic rest pain, tissue loss in the feet.  He denies postprandial pain, food fear.  His orthostatic hypotension has improved significantly with changes in his medication regimen.   The pt is  on a statin for cholesterol management.  The pt is  on a daily aspirin.   Other AC:  - The pt is  on medication for hypertension.   The pt is not diabetic.  Tobacco hx:  -  Past Medical History:  Diagnosis Date   Arthritis    Back pain    GERD  (gastroesophageal reflux disease)    Hypertension     Past Surgical History:  Procedure Laterality Date   CAROTID-SUBCLAVIAN BYPASS GRAFT Left 10/01/2021   Procedure: BYPASS GRAFT LEFT CAROTID-SUBCLAVIAN;  Surgeon: Broadus John, MD;  Location: [facility] OR;  Service: Vascular;  Laterality: Left;   THORACIC AORTIC ENDOVASCULAR STENT GRAFT N/A 10/01/2021   Procedure: THORACIC AORTIC ENDOVASCULAR STENT GRAFT;  Surgeon: Broadus John, MD;  Location: [facility] OR;  Service: Vascular;  Laterality: N/A;   ULTRASOUND GUIDANCE FOR VASCULAR ACCESS Right 10/01/2021   Procedure: ULTRASOUND GUIDANCE FOR VASCULAR ACCESS, RIGHT FEMORAL ARTERY;  Surgeon: Broadus John, MD;  Location: [facility];  Service: Vascular;  Laterality: Right;    Social History   Socioeconomic History   Marital status: Single    Spouse name: Not on file   Number of children: Not on file   Years of education: Not on file   Highest education level: Not on file  Occupational History   Not on file  Tobacco Use   Smoking status: Former    Packs/day: 1.00    Types: Cigarettes    Passive exposure: Past   Smokeless tobacco: Never  Vaping Use   Vaping Use: Never used  Substance and Sexual Activity   Alcohol use: Yes    Alcohol/week: 1.0 standard drink of alcohol    Types: 1 Cans of beer per week   Drug  use: No   Sexual activity: Yes  Other Topics Concern   Not on file  Social History Narrative   Not on file   Social Determinants of Health   Financial Resource Strain: Not on file  Food Insecurity: Not on file  Transportation Needs: Not on file  Physical Activity: Not on file  Stress: Not on file  Social Connections: Not on file  Intimate Partner Violence: Not on file   Family History  Problem Relation Age of Onset   Heart attack Mother    Prostate cancer Father     Current Outpatient Medications  Medication Sig Dispense Refill   acetaminophen (TYLENOL) 325 MG tablet Take 650 mg by mouth every 6 (six) hours as needed  for mild pain.     amLODipine (NORVASC) 10 MG tablet Take 1 tablet by mouth daily.     aspirin EC 81 MG tablet Take 1 tablet (81 mg total) by mouth daily at 6 (six) AM. Swallow whole. 30 tablet 12   atorvastatin (LIPITOR) 40 MG tablet Take 1 tablet (40 mg total) by mouth daily. 90 tablet 3   carvedilol (COREG) 12.5 MG tablet TAKE 1 TABLET (12.5MG  TOTAL) BY MOUTH TWICE A DAY WITH MEALS 60 tablet 11   hydrochlorothiazide (HYDRODIURIL) 12.5 MG tablet Take 1 tablet (12.5 mg total) by mouth daily. 90 tablet 3   Multiple Vitamin (MULTIVITAMIN WITH MINERALS) TABS tablet Take 1 tablet by mouth daily.     No current facility-administered medications for this visit.    Allergies  Allergen Reactions   Lisinopril Swelling and Other (See Comments)    Swelling of tongue, lips, and face.      REVIEW OF SYSTEMS:  [X]  denotes positive finding, [ ]  denotes negative finding Cardiac  Comments:  Chest pain or chest pressure:    Shortness of breath upon exertion:    Short of breath when lying flat:    Irregular heart rhythm:        Vascular    Pain in calf, thigh, or hip brought on by ambulation: X   Pain in feet at night that wakes you up from your sleep:     Blood clot in your veins:    Leg swelling:         Pulmonary    Oxygen at home:    Productive cough:     Wheezing:         Neurologic    Sudden weakness in arms or legs:     Sudden numbness in arms or legs:     Sudden onset of difficulty speaking or slurred speech:    Temporary loss of vision in one eye:     Problems with dizziness:         Gastrointestinal    Blood in stool:     Vomited blood:         Genitourinary    Burning when urinating:     Blood in urine:        Psychiatric    Major depression:         Hematologic    Bleeding problems:    Problems with blood clotting too easily:        Skin    Rashes or ulcers:        Constitutional    Fever or chills:      PHYSICAL EXAMINATION:  Vitals:   03/19/22 0940   BP: 126/79  Pulse: 79  Resp: 20  Temp: 98.2 F (36.8 C)  SpO2: 97%  Weight: 170 lb (77.1 kg)  Height: 6\' 2"  (1.88 m)    General:  WDWN in NAD; vital signs documented above Gait: Not observed HENT: WNL, normocephalic Pulmonary: normal non-labored breathing , without wheezing Cardiac: regular HR Abdomen: soft, NT, no masses Skin: without rashes Vascular Exam/Pulses:  Right Left  Radial 2+ (normal) 2+ (normal)  Ulnar 2+ (normal) 2+ (normal)  Femoral    Popliteal    DP 2+ 2+       Extremities: without ischemic changes, without Gangrene , without cellulitis; without open wounds;  Musculoskeletal: no muscle wasting or atrophy  Neurologic: A&O X 3;  No focal weakness or paresthesias are detected Psychiatric:  The pt has Normal affect.   Non-Invasive Vascular Imaging:    See scan  ASSESSMENT/PLAN: Brian Cannon is a 62 y.o. 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   The CT chest abdomen pelvis demonstrated no appreciable degenerative changes.  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  The left carotid subclavian bypass is widely patent with palpable pulse in the left wrist. I am not concerned about the elevated velocity at the celiac artery at this time as the SMA is normal.   Overall, I am happy with the scan and the way his aorta has remodeled.  We had a long discussion about continuing to follow for degenerative changes.  The etiology of his bilateral lower extremity pain is likely more musculoskeletal as he has palpable pulses in bilateral lower extremities with no significant inflow disease.  He would benefit from lower extremity ABI.  Unfortunately, over the course of our discussion, Eithen mentioned that he now has [employer] which is not within our network.   My plan is to have him follow-up at [facility] with Dr. Catalina Gravel.  I plan to call him in the coming weeks to transition care.   My plan would be to see Brian Cannon again in 6 months with repeat CT angiogram.  I will discuss this with Dr. Nyoka Cowden.  Broadus John, MD [facility] [phone]

## 2022-04-19 ENCOUNTER — Ambulatory Visit
Admission: RE | Admit: 2022-04-19 | Discharge: 2022-04-19 | Disposition: A | Discharge status: Home / Self Care | Payer: BLUE CROSS/BLUE SHIELD | Source: Ambulatory Visit | Attending: Family | Admitting: Family

## 2022-04-19 ENCOUNTER — Other Ambulatory Visit: Payer: Self-pay | Admitting: Family

## 2022-04-19 DIAGNOSIS — G8929 Other chronic pain: Secondary | ICD-10-CM

## 2022-04-26 ENCOUNTER — Telehealth: Payer: Self-pay

## 2022-04-26 NOTE — Telephone Encounter (Signed)
Pt called asking about the referral that Dr. Virl Cagey placed for a different vascular doc d/t insurance.  Checked to see if referral was in system. It was pending, so changed it to authorized, and msg sent to [place] to f/u.

## 2022-05-12 ENCOUNTER — Ambulatory Visit: Payer: BLUE CROSS/BLUE SHIELD | Admitting: Internal Medicine

## 2022-05-14 ENCOUNTER — Other Ambulatory Visit: Payer: Self-pay | Admitting: Vascular Surgery

## 2022-05-14 ENCOUNTER — Ambulatory Visit: Payer: BLUE CROSS/BLUE SHIELD | Attending: Internal Medicine | Admitting: Internal Medicine

## 2022-05-14 DIAGNOSIS — I71019 Dissection of thoracic aorta, unspecified: Secondary | ICD-10-CM

## 2022-08-02 ENCOUNTER — Other Ambulatory Visit: Payer: Self-pay | Admitting: Internal Medicine

## 2023-01-15 ENCOUNTER — Other Ambulatory Visit: Payer: Self-pay | Admitting: Internal Medicine

## 2023-02-26 ENCOUNTER — Other Ambulatory Visit: Payer: Self-pay | Admitting: Internal Medicine

## 2023-03-17 ENCOUNTER — Telehealth: Payer: Self-pay | Admitting: *Deleted

## 2023-03-17 DIAGNOSIS — K4020 Bilateral inguinal hernia, without obstruction or gangrene, not specified as recurrent: Secondary | ICD-10-CM | POA: Insufficient documentation

## 2023-03-17 NOTE — Telephone Encounter (Signed)
   Pre-operative Risk Assessment    Patient Name: Brian Cannon  DOB: 08/23/1960 MRN: [id number]   Date of last office visit: 01/13/22 DR. MARY BRANCH Date of next office visit: 03/22/23 DR. [facility]   Request for Surgical Clearance    Procedure:   B/L INGUINAL HERNIA REPAIR -ROBOTIC   Date of Surgery:  Clearance 05/06/23                                Surgeon:  DR. HATEM [facility] Surgeon's Group or Practice Name:  [facility] Phone number:  [phone] Fax number:  [phone]   Type of Clearance Requested:   - Medical  - Pharmacy:  Hold Aspirin      Type of Anesthesia:  Not Indicated   Additional requests/questions:    Brian Cannon   03/17/2023, 12:56 PM

## 2023-03-17 NOTE — Progress Notes (Signed)
 General Surgery Office Visit  Name:  Brian Cannon MRN:  [id number] Date:  03/17/2023  Referring Physician:  Rosaline Stephane Odea, PA-C [id number] [place],  [place] [address]  PCP:  Annabella Rupert Rigg, NP  Chief Complaint: Inguinal hernia   History of Present Illness:   Brian Cannon is a 63 y.o. male who presents for evaluation of a possible right inguinal hernia.  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  Patient has noticed over the last several months pain and discomfort in the right groin along with a bulge which he states  he is able to push back in.  On examination the patient has a small fat-containing reducible right inguinal hernia as well as a incidentally noted to the left sided reducible inguinal hernia.  Review the patient's most recent CT scan demonstrates both hernia defects with a left appearing to be a direct defect and right more consistent with an indirect defect.  Patient states that it is affecting his ability to perform his daily activities and has become more more bothersome and desires definitive management.  No other relevant findings.  Past Medical History:   History reviewed. No pertinent past medical history. History reviewed. No pertinent surgical history.  Past Family History:  No family history on file. Social History   Socioeconomic History  . Marital status: Legally Separated    Spouse name: Not on file  . Number of children: Not on file  . Years of education: Not on file  . Highest education level: Not on file  Occupational History  . Not on file  Tobacco Use  . Smoking status: Former    Current packs/day: 0.00    Types: Cigarettes    Quit date: 2019    Years since quitting: 6.0  . Smokeless tobacco: Never  Substance and Sexual Activity  . Alcohol use: Never  . Drug use: Never  . Sexual  activity: Not on file  Other Topics Concern  . Not on file  Social History Narrative  . Not on file   Social Drivers of Health   Food Insecurity: Not on file (09/03/2022)  Transportation Needs: Not at Risk (09/03/2022)   Received from [employer] [facility]   . Transportation: 1  Safety: Low Risk  (02/22/2023)   Safety   . How often does anyone, including family and friends, physically hurt you?: Never   . How often does anyone, including family and friends, insult or talk down to you?: Never   . How often does anyone, including family and friends, threaten you with harm?: Never   . How often does anyone, including family and friends, scream or curse at you?: Never  Living Situation: At Risk (09/03/2022)   Received from [facility]   . Housing: 2    Review of Systems  Constitutional: Negative.   HENT: Negative.    Eyes: Negative.   Respiratory: Negative.    Cardiovascular: Negative.   Gastrointestinal: Negative.   Genitourinary: Negative.   Musculoskeletal: Negative.   Skin: Negative.   Allergic/Immunologic: Negative.   Hematological: Negative.   All other systems reviewed and are negative.   Medications: No orders of the defined types were placed in this encounter.      Allergies: Allergies  Allergen Reactions  . Lisinopril  Other (See Comments) and Swelling    Swelling of tongue, lips, and  face.    Vital Signs: Review Flowsheet  More data may exist      06/14/2022 02/17/2023 02/22/2023 03/17/2023  ONCBCN RECENT VITALS  Height 188 cm 185 cm 185 cm 185 cm  Weight 78.926 kg 80.65 kg 81.194 kg 81.194 kg  BSA (Calculated - sq m) 2.03 m2 2.04 m2 2.04 m2 2.04 m2  Temp - - - 97.8 F (36.6 C)  BP 124/70 119/77 118/62 126/73  Heart Rate 76 89 94 91  Respiratory Rate 16 - - -  Oxygen Saturation 100 % 98 % 96 % 98 %    Physical Exam Vitals and nursing note reviewed.  Constitutional:      Appearance: Normal appearance.  Cardiovascular:      Rate and Rhythm: Normal rate and regular rhythm.  Pulmonary:     Effort: Pulmonary effort is normal.     Breath sounds: Normal breath sounds.  Abdominal:     General: Abdomen is flat.     Palpations: Abdomen is soft.     Hernia: A hernia is present. Hernia is present in the left inguinal area and right inguinal area.    Skin:    General: Skin is warm.     Capillary Refill: Capillary refill takes less than 2 seconds.  Neurological:     Mental Status: He is alert.  Psychiatric:        Mood and Affect: Mood normal.        Behavior: Behavior normal.     Laboratory Data:  No results found for this or any previous visit (from the past week).  Impression and Plan:  63 year old patient with constellation clinical findings most consistent with a    symptomatic right sided inguinal hernia and asymptomatic left inguinal hernia.  Given the above findings the risk benefits of observation versus operative intervention were discussed in detail.  Patient is opted for operative intervention.  The risks of surgery were discussed including bleeding, infection, inguinodynia, chronic pain, hernia recurrence, mesh infection as well as injury to surrounding structures in addition to the cardiopulmonary risks of anesthesia.  The patient voiced understanding and agreed to proceed.  PLAN  OR for Robotic assisted repair of bilateral inguinal hernias with mesh at [facility] main OR 05/06/2023  Obtain informed consent the day of surgery Pre -op ancef  2G  Pre-op Cardiology risk assessment   Chandler Lace, MD FACS General Acute Care & Robotic Surgery  [facility]

## 2023-03-17 NOTE — Telephone Encounter (Signed)
 Primary Cardiologist:Branch, Brian BRAVO, MD  Chart reviewed as part of pre-operative protocol coverage. Because of Brian Cannon's past medical history and time since last visit, he/she will require a follow-up visit in order to better assess preoperative cardiovascular risk.  Pre-op covering staff: -Patient has an upcoming appointment on 03/22/2023 with Dr. Ronal Ross at which time clearance will be addressed Appointment notes have been updated - Please contact requesting surgeon's office via preferred method (i.e, phone, fax) to inform them of need for appointment prior to surgery.  If applicable, this message will also be routed to pharmacy pool and/or primary cardiologist for input on holding anticoagulant/antiplatelet agent as requested below so that this information is available at time of patient's appointment.   Brian EMERSON Bane, NP-C  03/17/2023, 1:21 PM [address] Office [phone] Fax [phone]

## 2023-03-17 NOTE — Telephone Encounter (Signed)
 Will update all parties involved pt has appt 03/22/23 with Dr. Wyline Mood. Appt notes have been changed to reflect need PREOP CLEARANCE.

## 2023-03-21 NOTE — Progress Notes (Signed)
 Cardiology Office Note:    Date:  03/21/2023   ID:  Brian Cannon, DOB 1961-02-19, MRN [id number]  PCP:  Lang Dedra DASEN, MD   [facility] Providers Cardiologist:  Alvan Ronal BRAVO, MD     Referring MD: Lang Dedra DASEN, MD   No chief complaint on file. Aortic Dissection  History of Present Illness:    Brian Cannon is a 63 y.o. 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 Was managed on esmolol  drip which was weaned. Bps were in a good range on discharge. Today he notes some chest discomfort in the left chest. He notes with laying flat. Not associated with activity. Peppermint improves it. Since he went into the hospital.  He notes some deconditioning with walking. He typically goes to the gym and does a lot of walking at his job. He can do > 4 mets without cp or sob  He is planned for CT angio 07/22/2021.   He checks his blood pressures at home. Blood pressures one teens systolic, under 80 for diastolic.   No family hx of dissection. No drug use. Mother had  MI in her 36s.  Interim hx 01/13/2022 S/p TEVAR 10/01/2021. He's feeling fatigued with Bp meds  Recently stopped lisinopril  with lip swelling.  Continued on coreg  12.5 mg BID, norvasc  10 mg daily and HCTz 12.5 mg daily. Blood pressure are in good control.  Interim hx 03/22/2023 Brian Cannon is here today for preoperative assessment for a hernia repair. Also lumbar disc disease. He has chest aches at time. He is deconditioned, has some SOB. He can ambulate with daily activities without chest pressure.     Past Medical History:  Diagnosis Date   Arthritis    Back pain    GERD (gastroesophageal reflux disease)    Hypertension     Past Surgical History:  Procedure Laterality Date   CAROTID-SUBCLAVIAN BYPASS GRAFT Left 10/01/2021   Procedure:  BYPASS GRAFT LEFT CAROTID-SUBCLAVIAN;  Surgeon: Lanis Fonda BRAVO, MD;  Location: [facility] OR;  Service: Vascular;  Laterality: Left;   THORACIC AORTIC ENDOVASCULAR STENT GRAFT N/A 10/01/2021   Procedure: THORACIC AORTIC ENDOVASCULAR STENT GRAFT;  Surgeon: Lanis Fonda BRAVO, MD;  Location: [facility] OR;  Service: Vascular;  Laterality: N/A;   ULTRASOUND GUIDANCE FOR VASCULAR ACCESS Right 10/01/2021   Procedure: ULTRASOUND GUIDANCE FOR VASCULAR ACCESS, RIGHT FEMORAL ARTERY;  Surgeon: Lanis Fonda BRAVO, MD;  Location: [facility] OR;  Service: Vascular;  Laterality: Right;    Current Medications: No outpatient medications have been marked as taking for the 03/22/23 encounter (Appointment) with Alvan Ronal BRAVO, MD.     Allergies:   Lisinopril    Social History   Socioeconomic History   Marital status: Single    Spouse name: Not on file   Number of children: Not on file   Years of education: Not on file   Highest education level: Not on file  Occupational History   Not on file  Tobacco Use   Smoking status: Former    Current packs/day: 1.00    Types: Cigarettes    Passive exposure: Past   Smokeless tobacco: Never  Vaping Use   Vaping status: Never Used  Substance and Sexual Activity   Alcohol use: Yes    Alcohol/week: 1.0 standard drink of alcohol    Types: 1 Cans of beer per  week   Drug use: No   Sexual activity: Yes  Other Topics Concern   Not on file  Social History Narrative   Not on file   Social Drivers of Health   Financial Resource Strain: At Risk (09/03/2022)   Received from [employer]    Financial Resource Strain: 2  Food Insecurity: At Risk (09/03/2022)   Received from [employer]    Food: 2  Transportation Needs: Not at Risk (09/03/2022)   Received from [employer] Needs    Transportation: 1  Physical Activity: Not on File (11/20/2021)   Received from [place], [place]   Physical Activity    Physical Activity: 0  Stress: Not on File (11/20/2021)    Received from [facility], [place]   Stress    Stress: 0  Social Connections: Not on File (11/18/2022)   Received from [facility]   Social Connections    Connectedness: 0     Family History: The patient's family history includes Heart attack in his mother; Prostate cancer in his father.  ROS:   Please see the history of present illness.     All other systems reviewed and are negative.  EKGs/Labs/Other Studies Reviewed:    The following studies were reviewed today:   EKG:  EKG is  ordered today.  The ekg ordered today demonstrates   07/09/2021-NSR  01/13/2022- NSR   Cardiology Studies: TTE 3 /26/2023- EF 50-55   CT abd/pelvis 06/08/2021   VASCULAR   Aorta: Thoracoabdominal aortic dissection extends to the level of the aortic bifurcation. The celiac, SMA, and right renal artery arise from the true lumen. Left renal artery arises from the false lumen. Fairly symmetrical enhancement of the true and false lumen. Stable mild mural thickening of the aortic bifurcation and proximal bilateral iliac arteries, without evidence of retroperitoneal hematoma or rupture. Prominent atherosclerosis of the distal aorta at the level of bifurcation.   Celiac: Mild narrowing at the origin of the celiac artery due to the anterior margin of the dissection flap. The narrowing of the common hepatic artery seen on prior study has resolved, and may reflect spasm. No focal stenosis.   SMA: Mild narrowing at the origin the SMA due to the anterior aspect of the dissection flap, less than 50%. Remainder of the SMA is unremarkable.   Renals: Right renal is widely patent arising from the true lumen. The left renal is widely patent arising from the false lumen. No focal stenosis or renal artery dissection.   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   Inflow: Diffuse atherosclerosis throughout the bilateral common iliac arteries. Less than 50% narrowing  at the origin of the right common iliac artery, with 50-70% narrowing at the origin of the left common iliac artery. These are stable findings. Bilateral external and internal iliac arteries are widely patent.   Recent Labs: No results found for requested labs within last 365 days.   Recent Lipid Panel    Component Value Date/Time   CHOL 196 07/09/2021 0957   TRIG 121 07/09/2021 0957   HDL 55 07/09/2021 0957   CHOLHDL 3.6 07/09/2021 0957   LDLCALC 119 (H) 07/09/2021 0957     Risk Assessment/Calculations:      The 10-year ASCVD risk score (Arnett DK, et al., 2019) is: 13.4%   Values used to calculate the score:     Age: 7 years     Sex: Male  Is Non-Hispanic African American: Yes     Diabetic: No     Tobacco smoker: No     Systolic Blood Pressure: 126 mmHg     Is BP treated: Yes     HDL Cholesterol: 48 mg/dL     Total Cholesterol: 142 mg/dL      Physical Exam:    VS:   Vitals:   03/22/23 0906  BP: 126/76  Pulse: 98  SpO2: 94%      Wt Readings from Last 3 Encounters:  03/19/22 170 lb (77.1 kg)  01/13/22 170 lb 9.6 oz (77.4 kg)  10/30/21 152 lb (68.9 kg)     GEN:  Well nourished, well developed in no acute distress HEENT: Normal NECK: No JVD; No carotid bruits LYMPHATICS: No lymphadenopathy CARDIAC: RRR, no murmurs, rubs, gallops RESPIRATORY:  Clear to auscultation without rales, wheezing or rhonchi  ABDOMEN: Soft, non-tender, non-distended MUSCULOSKELETAL:  No edema; No deformity  SKIN: Warm and dry NEUROLOGIC:  Alert and oriented x 3 PSYCHIATRIC:  Normal affect   ASSESSMENT:    PreOp He can walk > 4 METS without  chest pressure. He is deconditioned but no signs of CHF. His dissection is repaired.  His blood pressures are well-controlled.  He is acceptable risk for surgery.  Type B dissection: S/p TEVAR. Stable. Goal is BP management.  HTN: taking norvasc  10 mg daily, carvedilol  12.5 mg BID and Hctz 12.5 mg daily. BP's are well controlled.    HLD: continue lipitor 40 mg daily.   PLAN:    In order of problems listed above:   He is acceptable cardiac risk for hernia repair as well as lumbar surgery if needed Follow up in 6 months with an APP         Medication Adjustments/Labs and Tests Ordered: Current medicines are reviewed at length with the patient today.  Concerns regarding medicines are outlined above.  No orders of the defined types were placed in this encounter.  No orders of the defined types were placed in this encounter.   There are no Patient Instructions on file for this visit.   Signed, Alvan Ronal BRAVO, MD  03/21/2023 4:46 PM    [facility]

## 2023-03-22 ENCOUNTER — Ambulatory Visit: Payer: BLUE CROSS/BLUE SHIELD | Attending: Internal Medicine | Admitting: Internal Medicine

## 2023-03-22 ENCOUNTER — Encounter: Payer: Self-pay | Admitting: Internal Medicine

## 2023-03-22 VITALS — BP 126/76 | HR 98 | Ht 74.0 in | Wt 180.6 lb

## 2023-03-22 DIAGNOSIS — I10 Essential (primary) hypertension: Secondary | ICD-10-CM

## 2023-03-22 NOTE — Patient Instructions (Addendum)
 Medication Instructions:  No changes today. *If you need a refill on your cardiac medications before your next appointment, please call your pharmacy*    Follow-Up: At [facility], you and your health needs are our priority.  As part of our continuing mission to provide you with exceptional heart care, we have created designated Provider Care Teams.  These Care Teams include your primary Cardiologist (physician) and Advanced Practice Providers (APPs -  Physician Assistants and Nurse Practitioners) who all work together to provide you with the care you need, when you need it.  We recommend signing up for the patient portal called MyChart.  Sign up information is provided on this After Visit Summary.  MyChart is used to connect with patients for Virtual Visits (Telemedicine).  Patients are able to view lab/test results, encounter notes, upcoming appointments, etc.  Non-urgent messages can be sent to your provider as well.   To learn more about what you can do with MyChart, go to [email or web address].    Your next appointment:   6 month(s)  Provider:   First Available APP

## 2023-03-27 ENCOUNTER — Other Ambulatory Visit: Payer: Self-pay | Admitting: Internal Medicine

## 2023-04-24 ENCOUNTER — Other Ambulatory Visit: Payer: Self-pay | Admitting: Internal Medicine

## 2023-09-15 ENCOUNTER — Telehealth: Payer: Self-pay

## 2023-09-15 NOTE — Telephone Encounter (Signed)
 left message x2 requesting pt to call back. Needing to confirm if the insurance is still correct (which is OON), or if needing to make changes if [facility] is now within network w/ pt's insurance. Waiting to hear back from pt. Duwaine Maiden, PAA II

## 2023-09-15 NOTE — Telephone Encounter (Signed)
 Patient called wanting to re-establish his care with Dr. Lanis.  He will need a CTA Chest/Abd/Pel and follow up with Dr. Lanis 10/25.

## 2023-09-20 DIAGNOSIS — M5442 Lumbago with sciatica, left side: Secondary | ICD-10-CM | POA: Diagnosis not present

## 2023-09-20 DIAGNOSIS — M5441 Lumbago with sciatica, right side: Secondary | ICD-10-CM | POA: Diagnosis not present

## 2023-09-20 DIAGNOSIS — G8929 Other chronic pain: Secondary | ICD-10-CM | POA: Diagnosis not present

## 2023-09-27 DIAGNOSIS — R1030 Lower abdominal pain, unspecified: Secondary | ICD-10-CM | POA: Diagnosis not present

## 2023-09-27 DIAGNOSIS — K4020 Bilateral inguinal hernia, without obstruction or gangrene, not specified as recurrent: Secondary | ICD-10-CM | POA: Diagnosis not present

## 2023-11-16 ENCOUNTER — Other Ambulatory Visit: Payer: Self-pay

## 2023-11-16 DIAGNOSIS — I71012 Dissection of descending thoracic aorta: Secondary | ICD-10-CM

## 2023-11-20 DIAGNOSIS — E7800 Pure hypercholesterolemia, unspecified: Secondary | ICD-10-CM | POA: Insufficient documentation

## 2023-11-20 DIAGNOSIS — R7303 Prediabetes: Secondary | ICD-10-CM | POA: Insufficient documentation

## 2023-11-20 NOTE — Progress Notes (Incomplete)
 Cardiology Office Note:   Date:  11/20/2023  ID:  Brian Cannon, DOB June 07, 1960, MRN [id number] PCP: No primary care provider on file.  [facility] Providers Cardiologist:  Georganna Archer, MD { Chief Complaint:  Chief Complaint  Patient presents with   Chest Pain    History of Present Illness:   Brian Cannon is a 63 y.o. male with a PMH of aortic dissection s/p TEVAR (10/01/21), HTN, HLD, prediabetes and prior tobacco use who presents for follow-up.  Mr. Hargens was previously followed by Dr. Ronal Ross and now presents to me for ongoing care.  He has a history of aortic dissection status post TEVAR and is followed by vascular surgery (currently scheduled to see them next month).  He is currently taking amlodipine  10 mg daily and HCTZ 12.5 mg daily.  He reports adherence to his medications.  He takes his amlodipine  half a tablet in the morning and half in the evening due to feelings of dizziness and nausea when taking a whole tablet.  The side effects have resolved since taking his medication this way.  He further takes atorvastatin  40 mg daily without issue.  He further reports having recent episodes of chest discomfort.  He describes left-sided chest aching that radiates to his left shoulder.  The pain is 3 out of 10 in intensity and worsens with exertion.  No associated diaphoresis, nausea, syncope, presyncope.  No further concerns.   Past Medical History:  Diagnosis Date   Arthritis    Back pain    GERD (gastroesophageal reflux disease)    Hypertension      Studies Reviewed:    EKG:  EKG Interpretation Date/Time:  Monday November 21 2023 11:39:55 EDT Ventricular Rate:  71 PR Interval:  196 QRS Duration:  86 QT Interval:  390 QTC Calculation: 423 R Axis:   25  Text Interpretation: Normal sinus rhythm Normal ECG When compared with ECG of 06-Jun-2021 09:42, QT has shortened Confirmed by Archer Georganna [phone]) on 11/21/2023 12:09:06 PM     Cardiac Studies &  Procedures   ______________________________________________________________________________________________     ECHOCARDIOGRAM  ECHOCARDIOGRAM COMPLETE 06/07/2021  Narrative ECHOCARDIOGRAM REPORT    Patient Name:   Brian Cannon Date of Exam: 06/07/2021 Medical Rec #:  [id number]     Height:       74.0 in Accession #:    [id number]    Weight:       168.9 lb Date of Birth:  05/12/1960     BSA:          2.023 m Patient Age:    60 years      BP:           106/49 mmHg Patient Gender: M             HR:           69 bpm. Exam Location:  Inpatient  Procedure: 2D Echo, Cardiac Doppler and Color Doppler  Indications:     Dyspnea  History:         Patient has no prior history of Echocardiogram examinations.  Sonographer:     Lauraine Pilot RDCS Referring Phys:  [id number] ERLE CLUNES ORTIZ Diagnosing Phys: Lonni Nanas MD  IMPRESSIONS   1. Left ventricular ejection fraction, by estimation, is 50 to 55%. The left ventricle has low normal function. The left ventricle has no regional wall motion abnormalities. There is mild left ventricular hypertrophy. Left ventricular diastolic parameters were normal. 2. Right ventricular systolic function is  normal. The right ventricular size is mildly enlarged. There is normal pulmonary artery systolic pressure. 3. The mitral valve is normal in structure. No evidence of mitral valve regurgitation. No evidence of mitral stenosis. 4. The aortic valve is tricuspid. Aortic valve regurgitation is not visualized. No aortic stenosis is present. 5. The inferior vena cava is normal in size with greater than 50% respiratory variability, suggesting right atrial pressure of 3 mmHg.  FINDINGS Left Ventricle: Left ventricular ejection fraction, by estimation, is 50 to 55%. The left ventricle has low normal function. The left ventricle has no regional wall motion abnormalities. The left ventricular internal cavity size was normal in size. There is mild left  ventricular hypertrophy. Left ventricular diastolic parameters were normal.  Right Ventricle: The right ventricular size is mildly enlarged. No increase in right ventricular wall thickness. Right ventricular systolic function is normal. There is normal pulmonary artery systolic pressure. The tricuspid regurgitant velocity is 1.80 m/s, and with an assumed right atrial pressure of 3 mmHg, the estimated right ventricular systolic pressure is 16.0 mmHg.  Left Atrium: Left atrial size was normal in size.  Right Atrium: Right atrial size was normal in size.  Pericardium: There is no evidence of pericardial effusion.  Mitral Valve: The mitral valve is normal in structure. No evidence of mitral valve regurgitation. No evidence of mitral valve stenosis.  Tricuspid Valve: The tricuspid valve is normal in structure. Tricuspid valve regurgitation is trivial.  Aortic Valve: The aortic valve is tricuspid. Aortic valve regurgitation is not visualized. No aortic stenosis is present.  Pulmonic Valve: The pulmonic valve was not well visualized. Pulmonic valve regurgitation is trivial.  Aorta: The aortic root and ascending aorta are structurally normal, with no evidence of dilitation.  Venous: The inferior vena cava is normal in size with greater than 50% respiratory variability, suggesting right atrial pressure of 3 mmHg.  IAS/Shunts: The interatrial septum was not well visualized.   LEFT VENTRICLE PLAX 2D LVIDd:         4.80 cm      Diastology LVIDs:         3.70 cm      LV e' medial:    7.43 cm/s LV PW:         1.00 cm      LV E/e' medial:  6.9 LV IVS:        1.10 cm      LV e' lateral:   10.20 cm/s LVOT diam:     2.50 cm      LV E/e' lateral: 5.0 LV SV:         77 LV SV Index:   38 LVOT Area:     4.91 cm  LV Volumes (MOD) LV vol d, MOD A2C: 138.0 ml LV vol d, MOD A4C: 120.0 ml LV vol s, MOD A2C: 63.1 ml LV vol s, MOD A4C: 61.6 ml LV SV MOD A2C:     74.9 ml LV SV MOD A4C:     120.0 ml LV  SV MOD BP:      71.1 ml  RIGHT VENTRICLE RV S prime:     14.60 cm/s TAPSE (M-mode): 2.4 cm  LEFT ATRIUM             Index        RIGHT ATRIUM           Index LA diam:        2.80 cm 1.38 cm/m   RA Area:  15.10 cm LA Vol (A2C):   56.6 ml 27.98 ml/m  RA Volume:   39.00 ml  19.28 ml/m LA Vol (A4C):   51.8 ml 25.61 ml/m LA Biplane Vol: 55.8 ml 27.59 ml/m AORTIC VALVE LVOT Vmax:   81.20 cm/s LVOT Vmean:  52.900 cm/s LVOT VTI:    0.156 m  AORTA Ao Root diam: 3.70 cm Ao Asc diam:  3.30 cm  MITRAL VALVE               TRICUSPID VALVE MV Area (PHT): 3.08 cm    TR Peak grad:   13.0 mmHg MV Decel Time: 246 msec    TR Vmax:        180.00 cm/s MV E velocity: 51.10 cm/s MV A velocity: 58.10 cm/s  SHUNTS MV E/A ratio:  0.88        Systemic VTI:  0.16 m Systemic Diam: 2.50 cm  Lonni Nanas MD Electronically signed by Lonni Nanas MD Signature Date/Time: 06/07/2021/2:41:55 PM    Final (Updated)          ______________________________________________________________________________________________      Risk Assessment/Calculations:              Physical Exam:   VS:  BP 124/66 (BP Location: Right Arm, Patient Position: Sitting, Cuff Size: Normal)   Pulse 71   Ht 6' 1 (1.854 m)   Wt 178 lb 12.8 oz (81.1 kg)   SpO2 94%   BMI 23.59 kg/m    Wt Readings from Last 3 Encounters:  03/22/23 180 lb 9.6 oz (81.9 kg)  03/19/22 170 lb (77.1 kg)  01/13/22 170 lb 9.6 oz (77.4 kg)     GEN: Well nourished, well developed in no acute distress NECK: No JVD; No carotid bruits CARDIAC: RRR, no murmurs, rubs, gallops RESPIRATORY:  Clear to auscultation without rales, wheezing or rhonchi  ABDOMEN: Soft, non-tender, non-distended, he has an abdominal binder on EXTREMITIES:  No edema; No deformity    Assessment & Plan Primary hypertension Patient's blood pressure is well-controlled on his current regiment with a goal of less than 130/80.  No changes to his  regimen at this time.   Precordial pain The patient's chest pain is concerning for possible cardiac etiology.  Given his multiple risk factors for CAD, I think he warrants noninvasive assessment for CAD.  We will pursue coronary CT angiography to further evaluate.  I gave the patient instructions to present to the ED if he develops sustained chest pain. -Coronary CTA  Pure hypercholesterolemia Needs a lipid panel checked so we will check today. - Check lipid panel  Descending thoracic dissection (HCC) - Has follow-up with vascular surgery next month with repeat CT imaging pending.  Further TEVAR management per vascular.  Prediabetes -A1c today          Signed, Georganna Archer, MD 11/20/2023 11:02 PM    [facility]

## 2023-11-20 NOTE — Assessment & Plan Note (Signed)
 A1c

## 2023-11-20 NOTE — Assessment & Plan Note (Signed)
 Needs a lipid panel checked so we will check today. - Check lipid panel

## 2023-11-20 NOTE — Assessment & Plan Note (Signed)
-   Has follow-up with vascular surgery next month with repeat CT imaging pending.  Further TEVAR management per vascular.

## 2023-11-21 ENCOUNTER — Ambulatory Visit
Attending: Student in an Organized Health Care Education/Training Program | Admitting: Student in an Organized Health Care Education/Training Program

## 2023-11-21 ENCOUNTER — Other Ambulatory Visit (HOSPITAL_COMMUNITY): Payer: Self-pay

## 2023-11-21 ENCOUNTER — Encounter: Payer: Self-pay | Admitting: Student in an Organized Health Care Education/Training Program

## 2023-11-21 VITALS — BP 124/66 | HR 71 | Ht 73.0 in | Wt 178.8 lb

## 2023-11-21 DIAGNOSIS — E7800 Pure hypercholesterolemia, unspecified: Secondary | ICD-10-CM

## 2023-11-21 DIAGNOSIS — I10 Essential (primary) hypertension: Secondary | ICD-10-CM

## 2023-11-21 DIAGNOSIS — R072 Precordial pain: Secondary | ICD-10-CM

## 2023-11-21 DIAGNOSIS — R7303 Prediabetes: Secondary | ICD-10-CM

## 2023-11-21 DIAGNOSIS — I71012 Dissection of descending thoracic aorta: Secondary | ICD-10-CM

## 2023-11-21 MED ORDER — METOPROLOL TARTRATE 100 MG PO TABS
100.0000 mg | ORAL_TABLET | Freq: Once | ORAL | 0 refills | Status: AC
  Filled 2023-11-21 – 2023-12-19 (×2): qty 1, 1d supply, fill #0

## 2023-11-21 NOTE — Assessment & Plan Note (Signed)
 Patient's blood pressure is well-controlled on his current regiment with a goal of less than 130/80.  No changes to his regimen at this time.

## 2023-11-21 NOTE — Patient Instructions (Signed)
 Medication Instructions:  metoprolol  tartrate (LOPRESSOR ) 100 MG tablet         Take 1 tablet (100 mg total) by mouth once for 1 dose. Take 90-120 minutes prior to scan. Hold for SBP less than 110.   *If you need a refill on your cardiac medications before your next appointment, please call your pharmacy*  Lab Work: Lipid panel  A1C  BMP  If you have labs (blood work) drawn today and your tests are completely normal, you will receive your results only by: MyChart Message (if you have MyChart) OR A paper copy in the mail If you have any lab test that is abnormal or we need to change your treatment, we will call you to review the results.  Testing/Procedures: CORONARY CTA  Your physician has requested that you have cardiac CT. Cardiac computed tomography (CT) is a painless test that uses an x-ray machine to take clear, detailed pictures of your heart. For further information please visit [email or web address] Please follow instruction sheet as given.    Follow-Up: At [facility], you and your health needs are our priority.  As part of our continuing mission to provide you with exceptional heart care, our providers are all part of one team.  This team includes your primary Cardiologist (physician) and Advanced Practice Providers or APPs (Physician Assistants and Nurse Practitioners) who all work together to provide you with the care you need, when you need it.  Your next appointment:   6 month(s)  Provider:   Georganna Archer, MD    Other Instructions   Your cardiac CT will be scheduled at one of the below locations:   [facility] [address] [place], [place] [address] [phone]  If scheduled at [facility], please arrive at the [facility] and Children's Entrance (Entrance C2) of [facility] 30 minutes prior to test start time. You can use the FREE valet parking offered at entrance C (encouraged to control the heart rate for the test)   Proceed to the [facility] Radiology Department (first floor) to check-in and test prep.  All radiology patients and guests should use entrance C2 at [facility], accessed from [facility], even though the hospital's physical address listed is [address].    If scheduled at [facility] or [facility], please arrive 15 mins early for check-in and test prep.  There is spacious parking and easy access to the radiology department from the [facility] Vascular entrance. Please enter here and check-in with the desk attendant.   Please follow these instructions carefully (unless otherwise directed):  An IV will be required for this test and Nitroglycerin  will be given.  Hold all erectile dysfunction medications at least 3 days (72 hrs) prior to test. (Ie viagra, cialis, sildenafil, tadalafil, etc)   On the Night Before the Test: Be sure to Drink plenty of water. Do not consume any caffeinated/decaffeinated beverages or chocolate 12 hours prior to your test. Do not take any antihistamines 12 hours prior to your test.  On the Day of the Test: Drink plenty of water until 1 hour prior to the test. Do not eat any food 1 hour prior to test. You may take your regular medications prior to the test.  Take metoprolol  (Lopressor ) two hours prior to test. If you take Furosemide/Hydrochlorothiazide /Spironolactone, please HOLD on the morning of the test. FEMALES- please wear underwire-free bra if available, avoid dresses & tight clothing  After the Test: Drink plenty of water. After receiving IV contrast, you may experience a mild flushed feeling. This is normal. On occasion, you may experience a mild rash up to 24 hours after the test. This is not dangerous. If this occurs, you can take Benadryl 25 mg and increase your fluid intake. If you experience trouble breathing, this can be serious. If it is severe call 911  IMMEDIATELY. If it is mild, please call our office. If you take any of these medications: Glipizide/Metformin, Avandament, Glucavance, please do not take 48 hours after completing test unless otherwise instructed.  We will call to schedule your test 2-4 weeks out understanding that some insurance companies will need an authorization prior to the service being performed.   For more information and frequently asked questions, please visit our website : [email or web address]  For non-scheduling related questions, please contact the cardiac imaging nurse navigator should you have any questions/concerns: Cardiac Imaging Nurse Navigators Direct Office Dial: [phone]   For scheduling needs, including cancellations and rescheduling, please call [place], [phone].

## 2023-11-24 ENCOUNTER — Ambulatory Visit: Payer: Self-pay | Admitting: Student in an Organized Health Care Education/Training Program

## 2023-11-24 LAB — BASIC METABOLIC PANEL WITH GFR
BUN/Creatinine Ratio: 13 (ref 10–24)
BUN: 13 mg/dL (ref 8–27)
CO2: 21 mmol/L (ref 20–29)
Calcium: 9.4 mg/dL (ref 8.6–10.2)
Chloride: 102 mmol/L (ref 96–106)
Creatinine, Ser: 1 mg/dL (ref 0.76–1.27)
Glucose: 100 mg/dL — ABNORMAL HIGH (ref 70–99)
Potassium: 4.2 mmol/L (ref 3.5–5.2)
Sodium: 139 mmol/L (ref 134–144)
eGFR: 84 mL/min/1.73 (ref >59)

## 2023-11-24 LAB — HEMOGLOBIN A1C
Est. average glucose Bld gHb Est-mCnc: 126 mg/dL
Hgb A1c MFr Bld: 6 — AB (ref 4.8–5.6)

## 2023-11-24 LAB — LIPID PANEL
Chol/HDL Ratio: 3.4 ratio (ref 0.0–5.0)
Cholesterol, Total: 152 mg/dL (ref 100–199)
HDL: 45 mg/dL (ref >39)
LDL Chol Calc (NIH): 89 mg/dL (ref 0–99)
Triglycerides: 99 mg/dL (ref 0–149)
VLDL Cholesterol Cal: 18 mg/dL (ref 5–40)

## 2023-11-25 DIAGNOSIS — M6289 Other specified disorders of muscle: Secondary | ICD-10-CM | POA: Insufficient documentation

## 2023-11-25 DIAGNOSIS — M6285 Dysfunction of the multifidus muscles, lumbar region: Secondary | ICD-10-CM | POA: Insufficient documentation

## 2023-11-25 DIAGNOSIS — R293 Abnormal posture: Secondary | ICD-10-CM | POA: Insufficient documentation

## 2023-11-25 DIAGNOSIS — R531 Weakness: Secondary | ICD-10-CM | POA: Insufficient documentation

## 2023-11-30 ENCOUNTER — Other Ambulatory Visit (HOSPITAL_COMMUNITY): Payer: Self-pay

## 2023-11-30 ENCOUNTER — Telehealth: Payer: Self-pay | Admitting: Student in an Organized Health Care Education/Training Program

## 2023-11-30 NOTE — Telephone Encounter (Signed)
   Pre-operative Risk Assessment    Patient Name: Brian Cannon  DOB: Jul 19, 1960 MRN: [id number]   Date of last office visit: 11/21/2023 Date of next office visit: Not yet scheduled   Request for Surgical Clearance    Procedure:  Dental Extraction - Amount of Teeth to be Pulled:  11  Date of Surgery:  Clearance 12/05/23                                Surgeon:  Blanchard Aurora  Surgeon's Group or Practice Name:  [facility] Phone number:  [phone] Fax number:  [phone]   Type of Clearance Requested:   - Medical  - Pharmacy:  Hold Apixaban (Eliquis) 5   Type of Anesthesia:  Local      Office asking if Epinephrine is okay for local anesthetic.  Additional requests/questions:    Bonney Larraine Salt   11/30/2023, 4:13 PM

## 2023-12-01 NOTE — Telephone Encounter (Signed)
 Tele or in office visit? Please advise.

## 2023-12-01 NOTE — Telephone Encounter (Signed)
   Patient Name: Brian Cannon  DOB: 10-28-1960 MRN: [id number]  Primary Cardiologist: Georganna Archer, MD  Chart reviewed as part of pre-operative protocol coverage. Pre-op clearance already addressed by colleagues in earlier phone notes. To summarize recommendations:   If its local then he can proceed. If general then he should probable get the CCTA first.  -Dr. Archer  No further cardiac testing needed at this time.  He is not on Eliquis.  No SBE prophylaxis needed.  Will route this bundled recommendation to requesting provider via Epic fax function and remove from pre-op pool. Please call with questions.  Orren LOISE Fabry, PA-C 12/01/2023, 4:22 PM

## 2023-12-01 NOTE — Telephone Encounter (Signed)
 Patient is not on Eliquis or any other blood thinner Does not need SBE prophylaxis

## 2023-12-01 NOTE — Telephone Encounter (Signed)
Office calling for update. Please advise  

## 2023-12-19 ENCOUNTER — Other Ambulatory Visit (HOSPITAL_COMMUNITY): Payer: Self-pay

## 2023-12-20 ENCOUNTER — Other Ambulatory Visit: Payer: Self-pay | Admitting: Cardiology

## 2023-12-20 ENCOUNTER — Ambulatory Visit (HOSPITAL_COMMUNITY)
Admission: RE | Admit: 2023-12-20 | Discharge: 2023-12-20 | Disposition: A | Discharge status: Home / Self Care | Source: Ambulatory Visit | Attending: Vascular Surgery | Admitting: Vascular Surgery

## 2023-12-20 ENCOUNTER — Ambulatory Visit (HOSPITAL_COMMUNITY)
Admission: RE | Admit: 2023-12-20 | Discharge: 2023-12-20 | Disposition: A | Source: Ambulatory Visit | Attending: Cardiology

## 2023-12-20 DIAGNOSIS — I2584 Coronary atherosclerosis due to calcified coronary lesion: Secondary | ICD-10-CM | POA: Insufficient documentation

## 2023-12-20 DIAGNOSIS — R072 Precordial pain: Secondary | ICD-10-CM | POA: Insufficient documentation

## 2023-12-20 DIAGNOSIS — I2510 Atherosclerotic heart disease of native coronary artery without angina pectoris: Secondary | ICD-10-CM | POA: Insufficient documentation

## 2023-12-20 DIAGNOSIS — I71012 Dissection of descending thoracic aorta: Secondary | ICD-10-CM | POA: Diagnosis not present

## 2023-12-20 DIAGNOSIS — R931 Abnormal findings on diagnostic imaging of heart and coronary circulation: Secondary | ICD-10-CM

## 2023-12-20 MED ORDER — NITROGLYCERIN 0.4 MG SL SUBL
0.8000 mg | SUBLINGUAL_TABLET | Freq: Once | SUBLINGUAL | Status: AC
  Administered 2023-12-20: 0.8 mg via SUBLINGUAL

## 2023-12-20 MED ORDER — IOHEXOL 350 MG/ML SOLN
100.0000 mL | Freq: Once | INTRAVENOUS | Status: AC | PRN
  Administered 2023-12-20: 100 mL via INTRAVENOUS

## 2023-12-21 ENCOUNTER — Telehealth: Payer: Self-pay | Admitting: Student in an Organized Health Care Education/Training Program

## 2023-12-21 DIAGNOSIS — E7800 Pure hypercholesterolemia, unspecified: Secondary | ICD-10-CM

## 2023-12-21 MED ORDER — ATORVASTATIN CALCIUM 40 MG PO TABS: 40.0000 mg | ORAL_TABLET | Freq: Two times a day (BID) | ORAL | Status: DC

## 2023-12-21 NOTE — Telephone Encounter (Signed)
 I called the patient to inform him about the results of his coronary CTA.  I informed the patient that he has a very high CAC score as well as substantial soft plaque that is not flow-limiting by CT-FFR.  I inquired about his symptoms and thankfully he says that he no longer has any chest discomfort without intervention.  He is currently taking atorvastatin  40 mg daily, but he inform me that he takes 20 mg in the morning and an additional 20 mg at night.  He takes it this way because previously he attributed some fatigue to the medication and this resolved once he started taking it twice daily.  I recommend that we increase his atorvastatin  to 80 mg to try for his LDL down to <55.  He will start taking 40 mg of atorvastatin  in the morning and additional 40 mg at night effective immediately.  I will order a 22-month lipid panel for him to have drawn at this higher dose of statin.  All questions were answered and the patient is in agreement.  Dr. Georganna Archer

## 2023-12-21 NOTE — Telephone Encounter (Signed)
 Pt returning call for results

## 2024-01-02 ENCOUNTER — Ambulatory Visit: Payer: Self-pay | Admitting: Surgery

## 2024-01-02 ENCOUNTER — Other Ambulatory Visit (HOSPITAL_COMMUNITY)

## 2024-01-03 NOTE — Progress Notes (Unsigned)
 Office Note     HPI: Brian Cannon is a 63 y.o. (03-25-1960) male presenting in follow up s/p left subclavian transposition, TEVAR for type B aortic dissection.  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  Case was complicated by small chyle leak that resolved with conservative treatment.    Win has not been seen in quite some time. Due to insurance changes I referred him to a colleague at [facility], Dr. Jerome.  Fortunately, his insurance changed again, and he can continue care here at [facility].  Over the last 18 months, Sheree has been doing well.  He has had several losses within his family, which has been difficult.  He continues to work through paperwork for disability.  He is working out again.  Notes no claudication.  Notes some asymmetry in strength stating his right arm is stronger than his left.  He also complains of bilateral lower extremity sciatica, which is limiting his ability to work.  He continues to see his long time girlfriend, who lives in [place].  Rather than driving, he uses the train to visit.  When driving, he tries to stand to 20 to 30-minute area.   The pt is  on a statin for cholesterol management.  The pt is  on a daily aspirin .   Other AC:  - The pt is  on medication for hypertension.   The pt is not diabetic.  Tobacco hx:  -  Past Medical History:  Diagnosis Date   Arthritis    Back pain    GERD (gastroesophageal reflux disease)    Hypertension     Past Surgical History:  Procedure Laterality Date   CAROTID-SUBCLAVIAN BYPASS GRAFT Left 10/01/2021   Procedure: BYPASS GRAFT LEFT CAROTID-SUBCLAVIAN;  Surgeon: Lanis Fonda BRAVO, MD;  Location: [facility] OR;  Service: Vascular;  Laterality: Left;   THORACIC AORTIC ENDOVASCULAR STENT GRAFT N/A 10/01/2021   Procedure: THORACIC AORTIC ENDOVASCULAR STENT GRAFT;  Surgeon: Lanis Fonda BRAVO, MD;   Location: [facility] OR;  Service: Vascular;  Laterality: N/A;   ULTRASOUND GUIDANCE FOR VASCULAR ACCESS Right 10/01/2021   Procedure: ULTRASOUND GUIDANCE FOR VASCULAR ACCESS, RIGHT FEMORAL ARTERY;  Surgeon: Lanis Fonda BRAVO, MD;  Location: [facility] OR;  Service: Vascular;  Laterality: Right;    Social History   Socioeconomic History   Marital status: Single    Spouse name: Not on file   Number of children: Not on file   Years of education: Not on file   Highest education level: Not on file  Occupational History   Not on file  Tobacco Use   Smoking status: Former    Current packs/day: 1.00    Types: Cigarettes    Passive exposure: Past   Smokeless tobacco: Never  Vaping Use   Vaping status: Never Used  Substance and Sexual Activity   Alcohol use: Yes    Alcohol/week: 1.0 standard drink of alcohol    Types: 1 Cans of beer per week   Drug use: No   Sexual activity: Yes  Other Topics Concern   Not on file  Social History Narrative   Not on file   Social Drivers of Health   Financial Resource Strain: At Risk (09/03/2022)   Received from [employer]    Financial Resource Strain: 2  Food Insecurity: At Risk (09/03/2022)   Received from [employer]  Food: 2  Transportation Needs: Not at Risk (09/03/2022)   Received from [employer] Needs    Transportation: 1  Physical Activity: Not on File (11/20/2021)   Received from [facility]   Physical Activity    Physical Activity: 0  Stress: Not on File (11/20/2021)   Received from [facility]   Stress    Stress: 0  Social Connections: Not on File (11/18/2022)   Received from [employer]   Social Connections    Connectedness: 0  Intimate Partner Violence: Not on file   Family History  Problem Relation Age of Onset   Heart attack Mother    Prostate cancer Father     Current Outpatient Medications  Medication Sig Dispense Refill   acetaminophen  (TYLENOL ) 325 MG tablet Take 650 mg by mouth every 6 (six) hours as  needed for mild pain.     amLODipine  (NORVASC ) 10 MG tablet Take 1 tablet by mouth daily. (Patient not taking: Reported on 11/21/2023)     aspirin  EC 81 MG tablet Take 1 tablet (81 mg total) by mouth daily at 6 (six) AM. Swallow whole. 30 tablet 12   atorvastatin  (LIPITOR) 40 MG tablet Take 1 tablet (40 mg total) by mouth 2 (two) times daily.     carvedilol  (COREG ) 12.5 MG tablet TAKE 1 TABLET BY MOUTH TWICE A DAY WITH FOOD 90 tablet 3   gabapentin (NEURONTIN) 300 MG capsule Take 300 mg by mouth in the morning and at bedtime.     hydrochlorothiazide  (HYDRODIURIL ) 12.5 MG tablet TAKE 1 TABLET BY MOUTH EVERY DAY 90 tablet 3   metoprolol  tartrate (LOPRESSOR ) 100 MG tablet Take 1 tablet (100 mg total) by mouth once for 1 dose. Take 90-120 minutes prior to scan. Hold for SBP less than 110. 1 tablet 0   Multiple Vitamin (MULTIVITAMIN WITH MINERALS) TABS tablet Take 1 tablet by mouth daily.     omeprazole (PRILOSEC) 40 MG capsule Take 40 mg by mouth daily.     No current facility-administered medications for this visit.    Allergies  Allergen Reactions   Lisinopril  Swelling and Other (See Comments)    Swelling of tongue, lips, and face.      REVIEW OF SYSTEMS:  [X]  denotes positive finding, [ ]  denotes negative finding Cardiac  Comments:  Chest pain or chest pressure:    Shortness of breath upon exertion:    Short of breath when lying flat:    Irregular heart rhythm:        Vascular    Pain in calf, thigh, or hip brought on by ambulation: X   Pain in feet at night that wakes you up from your sleep:     Blood clot in your veins:    Leg swelling:         Pulmonary    Oxygen at home:    Productive cough:     Wheezing:         Neurologic    Sudden weakness in arms or legs:     Sudden numbness in arms or legs:     Sudden onset of difficulty speaking or slurred speech:    Temporary loss of vision in one eye:     Problems with dizziness:         Gastrointestinal    Blood in stool:      Vomited blood:         Genitourinary    Burning when urinating:     Blood in urine:  Psychiatric    Major depression:         Hematologic    Bleeding problems:    Problems with blood clotting too easily:        Skin    Rashes or ulcers:        Constitutional    Fever or chills:      PHYSICAL EXAMINATION:  There were no vitals filed for this visit.   General:  WDWN in NAD; vital signs documented above Gait: Not observed HENT: WNL, normocephalic Pulmonary: normal non-labored breathing , without wheezing Cardiac: regular HR Abdomen: soft, NT, no masses Skin: without rashes Vascular Exam/Pulses:  Right Left  Radial 2+ (normal) 2+ (normal)  Ulnar 2+ (normal) 2+ (normal)  Femoral    Popliteal    DP 2+ 2+       Extremities: without ischemic changes, without Gangrene , without cellulitis; without open wounds;  Musculoskeletal: no muscle wasting or atrophy  Neurologic: A&O X 3;  No focal weakness or paresthesias are detected Psychiatric:  The pt has Normal affect.   Non-Invasive Vascular Imaging:    See scan  ASSESSMENT/PLAN: Gaspard Isbell is a 63 y.o. 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     The CT chest abdomen pelvis demonstrated no appreciable degenerative changes.  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  Distally, the false lumen is patent and the infrarenal aorta allowing flow to the left kidney.  This is unchanged from previous.  The left carotid subclavian bypass is widely patent with palpable pulse in the left wrist.  Overall, I am happy with the scan and the way his aorta has remodeled.  He has had no degenerative changes in the last 2 years. We had a long discussion about continuing to follow for degenerative changes.    My plan is to see him in 1 years time with repeat CT-being that he is young, and we are following for degenerative changes, my plan is to order a low  radiation noncontrasted CT chest abdomen pelvis.   Fonda FORBES Rim, MD [facility] [phone]

## 2024-01-05 ENCOUNTER — Encounter: Payer: Self-pay | Admitting: Vascular Surgery

## 2024-01-05 ENCOUNTER — Ambulatory Visit: Attending: Vascular Surgery | Admitting: Vascular Surgery

## 2024-01-05 VITALS — BP 131/80 | HR 84 | Temp 97.6°F | Resp 18 | Ht 73.0 in | Wt 175.7 lb

## 2024-01-05 DIAGNOSIS — I71012 Dissection of descending thoracic aorta: Secondary | ICD-10-CM | POA: Diagnosis present

## 2024-01-05 DIAGNOSIS — Z95828 Presence of other vascular implants and grafts: Secondary | ICD-10-CM | POA: Insufficient documentation

## 2024-01-27 ENCOUNTER — Telehealth: Payer: Self-pay | Admitting: Student in an Organized Health Care Education/Training Program

## 2024-01-27 DIAGNOSIS — E7800 Pure hypercholesterolemia, unspecified: Secondary | ICD-10-CM

## 2024-01-27 NOTE — Telephone Encounter (Signed)
 Pt c/o medication issue:  1. Name of Medication:   atorvastatin  (LIPITOR) 40 MG tablet    2. How are you currently taking this medication (dosage and times per day)?    3. Are you having a reaction (difficulty breathing--STAT)? no  4. What is your medication issue? Patient is having issues with the medication. Want to see what other options there are. Please advise

## 2024-01-31 NOTE — Telephone Encounter (Signed)
 Pt is requesting a callback at [phone] regarding the side effects of this medication causing him to have episodes of elevated BP, nausea, fever, headaches and dizziness. Pt hasn't taken medication since yesterday and he stated something needs to be done. Please advise.

## 2024-01-31 NOTE — Telephone Encounter (Signed)
 Spoke with patient of Dr. Floretta. He is concerned that atorvastatin  is causing SE - has been on this med for at least 2 years. He said this med has always caused him problems.   He reports his BP is elevated but does not have a BP cuff.   1 week ago (Tuesday 11/11) Felt nervous/weak >> felt warm >> felt pressure going up - he was not sure if BP or sugar >> nausea >> instant rush to bathroom + vomiting.  He has felt like he will pass out during this.  Lying on cold floor and splashing face with cold water alleviates this.   Yesterday, Monday 11/17 -- symptoms same as last week  Collecting his urine - 26 oz UOP since last evening. Hydrates with alkaline water.   Advised to HOLD statin x2 weeks to see if symptoms are due to this med. Advised if symptoms resolve, may be related. If symptoms persist, less likely related.   Of note, he also thought atorvastatin  was to manage blood sugars. He is prediabetic.   Will route to Dr. Floretta   Total time: 20 minutes

## 2024-02-01 ENCOUNTER — Other Ambulatory Visit: Payer: Self-pay

## 2024-02-01 ENCOUNTER — Emergency Department: Admission: EM | Admit: 2024-02-01 | Discharge: 2024-02-01 | Disposition: A | Discharge status: Home / Self Care

## 2024-02-01 DIAGNOSIS — R079 Chest pain, unspecified: Secondary | ICD-10-CM | POA: Insufficient documentation

## 2024-02-01 DIAGNOSIS — R42 Dizziness and giddiness: Secondary | ICD-10-CM | POA: Insufficient documentation

## 2024-02-01 DIAGNOSIS — R112 Nausea with vomiting, unspecified: Secondary | ICD-10-CM | POA: Diagnosis present

## 2024-02-01 LAB — CBC
HCT: 40.8 % (ref 39.0–52.0)
Hemoglobin: 14.7 g/dL (ref 13.0–17.0)
MCH: 33.2 pg (ref 26.0–34.0)
MCHC: 36 g/dL (ref 30.0–36.0)
MCV: 92.1 fL (ref 80.0–100.0)
Platelets: 210 K/uL (ref 150–400)
RBC: 4.43 MIL/uL (ref 4.22–5.81)
RDW: 12.6 % (ref 11.5–15.5)
WBC: 7 K/uL (ref 4.0–10.5)
nRBC: 0 % (ref 0.0–0.2)

## 2024-02-01 LAB — COMPREHENSIVE METABOLIC PANEL WITH GFR
ALT: 28 U/L (ref 0–44)
AST: 27 U/L (ref 15–41)
Albumin: 4.3 g/dL (ref 3.5–5.0)
Alkaline Phosphatase: 80 U/L (ref 38–126)
Anion gap: 14 (ref 5–15)
BUN: 12 mg/dL (ref 8–23)
CO2: 25 mmol/L (ref 22–32)
Calcium: 9.1 mg/dL (ref 8.9–10.3)
Chloride: 101 mmol/L (ref 98–111)
Creatinine, Ser: 0.91 mg/dL (ref 0.61–1.24)
GFR, Estimated: >60 mL/min (ref >60)
Glucose, Bld: 112 mg/dL — ABNORMAL HIGH (ref 70–99)
Potassium: 3.2 mmol/L — ABNORMAL LOW (ref 3.5–5.1)
Sodium: 140 mmol/L (ref 135–145)
Total Bilirubin: 0.4 mg/dL (ref 0.0–1.2)
Total Protein: 7.4 g/dL (ref 6.5–8.1)

## 2024-02-01 LAB — TROPONIN T, HIGH SENSITIVITY: Troponin T High Sensitivity: <15 ng/L (ref 0–19)

## 2024-02-01 MED ORDER — ONDANSETRON 4 MG PO TBDP: 4.0000 mg | ORAL_TABLET | Freq: Three times a day (TID) | ORAL | 0 refills | Status: AC | PRN

## 2024-02-01 NOTE — ED Notes (Signed)
 PT  VOL

## 2024-02-01 NOTE — ED Triage Notes (Addendum)
 Pt arrives via [facility] from home with c/o panic attack on Monday and then another episode today. Pt has been having episodes since 2023, today will be a total of 6. Pt denies CP, abd pain, HA, congestion.Pt denies LOC. Pt is A&Ox4 during triage.

## 2024-02-01 NOTE — ED Triage Notes (Addendum)
 Pt comes via [facility] from home with anxiety. Pt has episodes like this since 2023 but increased here recently. Pt states dizziness and no sob.   Pt has 18 in left AC. VSS

## 2024-02-01 NOTE — ED Provider Triage Note (Signed)
 Emergency Medicine Provider Triage Evaluation Note  Brian Cannon , a 63 y.o. male  was evaluated in triage.  Pt complains of episodes of nausea, dizziness, feeling hot and cold, not feeling well today, on Monday, and 1 a week ago. Reports it seems to be after he takes his acid reflux or statin medication. No headache, vomiting, CP or abdominal pain at this time. Has had intermittent episodes like this since 2023.   Physical Exam  BP 128/69   Pulse 79   Temp 98.7 F (37.1 C) (Oral)   Resp 17   Ht 6' 1 (1.854 m)   Wt 78.9 kg   SpO2 99%   BMI 22.96 kg/m  Gen:   Awake, no distress   Resp:  Normal effort  MSK:   Moves extremities without difficulty  Other:    Medical Decision Making  Medically screening exam initiated at 4:51 PM.  Appropriate orders placed.  Brian Cannon was informed that the remainder of the evaluation will be completed by another provider, this initial triage assessment does not replace that evaluation, and the importance of remaining in the ED until their evaluation is complete.  Labs, EKG ordered.   Sheron Morley, [place] 02/01/24 1654

## 2024-02-01 NOTE — Discharge Instructions (Signed)
 You were seen today due to concern of episodes of nausea and vomiting.  It is possible that this may be from the recent change in your medication dosage, however I would recommend following up with a gastroenterologist to discuss further management of this.  If you have any worsening of symptoms such as episodes of passing out, severely increased chest pain, shortness of breath, or any other symptoms you find concerning please return to the emergency department immediately for further medical management.

## 2024-02-01 NOTE — ED Provider Notes (Signed)
 [facility] Provider Note    Event Date/Time   First MD Initiated Contact with Patient 02/01/24 1730     (approximate)   History   Panic Attack   HPI  Brian Cannon is a 63 y.o. male who presents today with concern of nausea vomiting.  So it seems that on Tuesday he was woken up from sleeping with concern of nausea feeling some chest tightness and lightheadedness.  He went and attempted to throw up in the bathroom, but was not successful he laid on the ground the bathroom for a few minutes and eventually was able to vomit and then he felt much better.  He was able to go to bed and he felt better for the rest of the week up until Monday night when he had recurrence of the symptoms, he was able to vomit much quicker that time and he states that the symptoms were not as bad at night.  He then had a similar symptom happen again today after having dinner which caused him to present for further evaluation and assessment.  He currently states that he feels back to normal and he has no other complaints.  No history of similar before in the past.  He attributes this to having increased his dose of atorvastatin  about a month ago.  He has been taking 20 mg twice a day but now he takes 40 mg twice a day after having the symptoms onset, he had talked to his cardiologist who recommended holding off on further doses of this and seeing if the symptoms recur.  He had stopped the medication yesterday.  He came at the urging of his wife and daughter.  No other complaints at this time.      Physical Exam   Triage Vital Signs: ED Triage Vitals  Encounter Vitals Group     BP 02/01/24 1641 128/69     Girls Systolic BP Percentile --      Girls Diastolic BP Percentile --      Boys Systolic BP Percentile --      Boys Diastolic BP Percentile --      Pulse Rate 02/01/24 1641 79     Resp 02/01/24 1641 17     Temp 02/01/24 1641 98.7 F (37.1 C)     Temp Source 02/01/24 1641 Oral      SpO2 02/01/24 1641 99 %     Weight 02/01/24 1650 174 lb (78.9 kg)     Height 02/01/24 1650 6' 1 (1.854 m)     Head Circumference --      Peak Flow --      Pain Score 02/01/24 1649 0     Pain Loc --      Pain Education --      Exclude from Growth Chart --     Most recent vital signs: Vitals:   02/01/24 1641 02/01/24 1910  BP: 128/69 (!) 155/80  Pulse: 79 69  Resp: 17 18  Temp: 98.7 F (37.1 C)   SpO2: 99% 97%     General: Awake, no distress.  CV:  Good peripheral perfusion.  Resp:  Normal effort.  Abd:  No distention.  Other:     ED Results / Procedures / Treatments   Labs (all labs ordered are listed, but only abnormal results are displayed) Labs Reviewed  COMPREHENSIVE METABOLIC PANEL WITH GFR - Abnormal; Notable for the following components:      Result Value   Potassium 3.2 (*)  Glucose, Bld 112 (*)    All other components within normal limits  CBC  TROPONIN T, HIGH SENSITIVITY     EKG  Sinus rhythm with a rate of about 75, axis of about 15, intervals appear to be within normal limits, no obvious ischemia that I appreciate this EKG   RADIOLOGY   PROCEDURES:  Critical Care performed: No  Procedures   MEDICATIONS ORDERED IN ED: Medications - No data to display   IMPRESSION / MDM / ASSESSMENT AND PLAN / ED COURSE  I reviewed the triage vital signs and the nursing notes.                               Patient's presentation is most consistent with acute complicated illness / injury requiring diagnostic workup.  63 year old male who presents today with concern of nausea vomiting and lightheadedness with brief episodes occurring about 3 times over the last week.  He at this time appears well he is not in any acute distress.  Given the presentation, possibly related to medication changes, but also possibly from other gastroenterologic issue.  We obtained labs here which are reassuring, troponin is also within normal limits and EKG without evidence  of ischemia.  Given the presentation, did recommend continuing to hold off on the atorvastatin  as directed by his cardiologist and primary doctor and to follow-up with them as instructed.  Also provided with a brief course of Zofran  and follow-up instructions with gastroenterology and return precautions.       FINAL CLINICAL IMPRESSION(S) / ED DIAGNOSES   Final diagnoses:  Nausea and vomiting, unspecified vomiting type     Rx / DC Orders   ED Discharge Orders          Ordered    ondansetron  (ZOFRAN -ODT) 4 MG disintegrating tablet  Every 8 hours PRN        02/01/24 1858             Note:  This document was prepared using Dragon voice recognition software and may include unintentional dictation errors.   Fernand Rossie HERO, MD 02/02/24 [phone]

## 2024-02-10 ENCOUNTER — Other Ambulatory Visit: Payer: Self-pay

## 2024-02-10 ENCOUNTER — Emergency Department
Admission: EM | Admit: 2024-02-10 | Discharge: 2024-02-10 | Disposition: A | Discharge status: Home / Self Care | Attending: Emergency Medicine | Admitting: Emergency Medicine

## 2024-02-10 DIAGNOSIS — I10 Essential (primary) hypertension: Secondary | ICD-10-CM | POA: Insufficient documentation

## 2024-02-10 DIAGNOSIS — F419 Anxiety disorder, unspecified: Secondary | ICD-10-CM | POA: Diagnosis present

## 2024-02-10 DIAGNOSIS — R1084 Generalized abdominal pain: Secondary | ICD-10-CM | POA: Diagnosis not present

## 2024-02-10 DIAGNOSIS — R110 Nausea: Secondary | ICD-10-CM | POA: Diagnosis not present

## 2024-02-10 LAB — URINALYSIS, ROUTINE W REFLEX MICROSCOPIC
Bilirubin Urine: NEGATIVE
Glucose, UA: NEGATIVE mg/dL
Hgb urine dipstick: NEGATIVE
Ketones, ur: 5 mg/dL — AB
Leukocytes,Ua: NEGATIVE
Nitrite: NEGATIVE
Protein, ur: NEGATIVE mg/dL
Specific Gravity, Urine: 1.02 (ref 1.005–1.030)
pH: 5 (ref 5.0–8.0)

## 2024-02-10 LAB — COMPREHENSIVE METABOLIC PANEL WITH GFR
ALT: 17 U/L (ref 0–44)
AST: 18 U/L (ref 15–41)
Albumin: 4.4 g/dL (ref 3.5–5.0)
Alkaline Phosphatase: 77 U/L (ref 38–126)
Anion gap: 11 (ref 5–15)
BUN: 14 mg/dL (ref 8–23)
CO2: 25 mmol/L (ref 22–32)
Calcium: 9.6 mg/dL (ref 8.9–10.3)
Chloride: 102 mmol/L (ref 98–111)
Creatinine, Ser: 0.95 mg/dL (ref 0.61–1.24)
GFR, Estimated: >60 mL/min (ref >60)
Glucose, Bld: 93 mg/dL (ref 70–99)
Potassium: 3.6 mmol/L (ref 3.5–5.1)
Sodium: 138 mmol/L (ref 135–145)
Total Bilirubin: 0.7 mg/dL (ref 0.0–1.2)
Total Protein: 7.7 g/dL (ref 6.5–8.1)

## 2024-02-10 LAB — CBC
HCT: 42.6 % (ref 39.0–52.0)
Hemoglobin: 15 g/dL (ref 13.0–17.0)
MCH: 32.9 pg (ref 26.0–34.0)
MCHC: 35.2 g/dL (ref 30.0–36.0)
MCV: 93.4 fL (ref 80.0–100.0)
Platelets: 215 K/uL (ref 150–400)
RBC: 4.56 MIL/uL (ref 4.22–5.81)
RDW: 12.5 % (ref 11.5–15.5)
WBC: 6.2 K/uL (ref 4.0–10.5)
nRBC: 0 % (ref 0.0–0.2)

## 2024-02-10 LAB — LIPASE, BLOOD: Lipase: 27 U/L (ref 11–51)

## 2024-02-10 NOTE — ED Notes (Signed)
 See triage note  Presents with abd pain  States he also had syncopal episode  States he has a hx anxiety  and some GI

## 2024-02-10 NOTE — Discharge Instructions (Signed)
 Keep your appointment with the gastroenterologist on December 4.  Begin taking your Prilosec that was prescribed for you on a daily basis every day.  Avoid foods that you know may upset your stomach.  Increase fluids.  Return to the emergency department over the holiday weekend if any severe worsening of your symptoms or urgent concerns.

## 2024-02-10 NOTE — ED Triage Notes (Signed)
 Pt comes via [facility] from home with c/o belly pain and syncopal episode. Pt does have hx of anxiety. Pt started to have anxiety attack and had brief episode during and then pt came back to  cognition. Pt states some dizziness and nausea currently.  VSS  CBG 107 148/90 98% RA

## 2024-02-10 NOTE — ED Provider Notes (Signed)
 [facility] Provider Note    Event Date/Time   First MD Initiated Contact with Patient 02/10/24 1152     (approximate)   History   Abdominal Pain   HPI  Brian Cannon is a 63 y.o. male   presents to the ED via EMS from home with complaint of anxiety, nausea, and abdominal pain.  Patient was seen in the ED on 02/01/2024 at which time he was treated for nausea and vomiting with Zofran .  He denies any fever, chills or recent vomiting.  Patient states that the abdominal pain is intermittent.  BMs have remained normal and with no melena.  He also discusses he is feeling that his blood pressure is going up which makes him anxious and feels like he is going to faint.  He is not actually taking his blood pressure at home but goes by the way he is feeling.  This makes him feel like he is going to faint which increases his anxiety.  He denies any changes in his routine, eating pattern, no alcohol use.  Patient had a history of AAA, thoracic aortic dissection, hypertension, hypokalemia, hyponatremia, hypomagnesia, abdominal cramps, prediabetes.      Physical Exam   Triage Vital Signs: ED Triage Vitals  Encounter Vitals Group     BP 02/10/24 1202 116/83     Girls Systolic BP Percentile --      Girls Diastolic BP Percentile --      Boys Systolic BP Percentile --      Boys Diastolic BP Percentile --      Pulse Rate 02/10/24 1202 85     Resp 02/10/24 1202 18     Temp 02/10/24 1202 98.3 F (36.8 C)     Temp Source 02/10/24 1202 Oral     SpO2 02/10/24 1202 97 %     Weight 02/10/24 1201 174 lb 2.6 oz (79 kg)     Height 02/10/24 1201 6' 1 (1.854 m)     Head Circumference --      Peak Flow --      Pain Score 02/10/24 1113 0     Pain Loc --      Pain Education --      Exclude from Growth Chart --     Most recent vital signs: Vitals:   02/10/24 1202  BP: 116/83  Pulse: 85  Resp: 18  Temp: 98.3 F (36.8 C)  SpO2: 97%     General: Awake, no distress.   Talkative, cooperative. CV:  Good peripheral perfusion.  Resp:  Normal effort.  Abd:  No distention. Soft, flat, with diffuse tenderness though out but no point tenderness, referred pain or rigidity.  Bowel sounds normoactive x 4 quadrants. Other:     ED Results / Procedures / Treatments   Labs (all labs ordered are listed, but only abnormal results are displayed) Labs Reviewed  URINALYSIS, ROUTINE W REFLEX MICROSCOPIC - Abnormal; Notable for the following components:      Result Value   Color, Urine YELLOW (*)    APPearance HAZY (*)    Ketones, ur 5 (*)    All other components within normal limits  COMPREHENSIVE METABOLIC PANEL WITH GFR  CBC  LIPASE, BLOOD  CBG MONITORING, ED     EKG  Vent. rate 72 BPM PR interval 194 ms QRS duration 80 ms QT/QTcB 406/444 ms P-R-T axes 60 23 25 Normal sinus rhythm Normal ECG When compared with ECG of 01-Feb-2024 16:49,   RADIOLOGY Deferred  PROCEDURES:  Critical Care performed:   Procedures   MEDICATIONS ORDERED IN ED: Medications - No data to display   IMPRESSION / MDM / ASSESSMENT AND PLAN / ED COURSE  I reviewed the triage vital signs and the nursing notes.   Differential diagnosis includes, but is not limited to, generalized abdominal pain, GERD, viral gastritis, peptic ulcer disease, viral illness, urinary tract infection, cholelithiasis, cholecystitis, pancreatitis.  63 year old male presents to the ED with complaint of generalized abdominal pain.  Patient was seen in the emergency department on 02/01/2024 for nausea and vomiting and was prescribed Zofran  which helped his symptoms.  He states that he ate some barbecued ribs which may have caused exacerbation of his symptoms.  His PCP had written him a prescription for Prilosec to be taken daily but he understood that he was to take it as needed.  Patient currently is scheduled for a endoscopy on December 4.  After discussing the findings of his lab work with him today he  wants to delay having a CT scan done.  He states that he will begin taking his Prilosec on a daily basis and abstain from any foods that might exacerbate his symptoms.  He is more interested in having his endoscopy done.  I explained to him that if it anytime over the weekend his symptoms become worse he is to return to the emergency department for reevaluation.  I did review the lab work done on 02/01/2024 which is unchanged essentially.      Patient's presentation is most consistent with acute complicated illness / injury requiring diagnostic workup.  FINAL CLINICAL IMPRESSION(S) / ED DIAGNOSES   Final diagnoses:  Generalized abdominal pain  Anxiety     Rx / DC Orders   ED Discharge Orders     None        Note:  This document was prepared using Dragon voice recognition software and may include unintentional dictation errors.   [name], PA-C 02/10/24 1456    [name], MD 02/10/24 [phone]

## 2024-02-13 NOTE — Telephone Encounter (Signed)
 FYI- following up with this patient.

## 2024-02-14 ENCOUNTER — Telehealth: Payer: Self-pay | Admitting: Student in an Organized Health Care Education/Training Program

## 2024-02-14 MED ORDER — ROSUVASTATIN CALCIUM 20 MG PO TABS: 20.0000 mg | ORAL_TABLET | Freq: Every day | ORAL | 3 refills | Status: AC

## 2024-02-14 NOTE — Telephone Encounter (Signed)
 I called Brian Cannon regarding his GI symptoms. Says that he has been having nausea and GI upset 6 weeks after I increased his dose of atorvastatin . He is doing a 2 week statin holiday and feels somewhat better but he still has some GI upset. He is undergoing an upper endoscopy this week. We discussed various options for lowering his cholesterol. Ultimately we decided to switch his statin to rosuvastatin 20 mg daily and stop atorvastatin . I will recheck his lipid panel in 8 weeks.   [name] T. Floretta HEATH, MD [place]  [facility]  02/14/2024 9:20 AM

## 2024-02-14 NOTE — Telephone Encounter (Signed)
All orders placed

## 2024-02-17 ENCOUNTER — Telehealth: Payer: Self-pay | Admitting: *Deleted

## 2024-02-17 ENCOUNTER — Ambulatory Visit
Admission: EM | Admit: 2024-02-17 | Discharge: 2024-02-17 | Disposition: A | Discharge status: Home / Self Care | Attending: Physician Assistant | Admitting: Physician Assistant

## 2024-02-17 DIAGNOSIS — N3001 Acute cystitis with hematuria: Secondary | ICD-10-CM | POA: Diagnosis not present

## 2024-02-17 DIAGNOSIS — R8290 Unspecified abnormal findings in urine: Secondary | ICD-10-CM | POA: Diagnosis not present

## 2024-02-17 LAB — POCT URINE DIPSTICK
Bilirubin, UA: NEGATIVE
Glucose, UA: 100 mg/dL — AB
Nitrite, UA: POSITIVE — AB
POC PROTEIN,UA: 30 — AB
Spec Grav, UA: 1.01 (ref 1.010–1.025)
Urobilinogen, UA: 1 U/dL
pH, UA: 5.5 (ref 5.0–8.0)

## 2024-02-17 LAB — GLUCOSE, POCT (MANUAL RESULT ENTRY): POCT Glucose (KUC): 103 mg/dL — AB (ref 70–99)

## 2024-02-17 MED ORDER — CEFTRIAXONE SODIUM 1 G IJ SOLR
1.0000 g | Freq: Once | INTRAMUSCULAR | Status: AC
  Administered 2024-02-17: 1 g via INTRAMUSCULAR

## 2024-02-17 MED ORDER — SULFAMETHOXAZOLE-TRIMETHOPRIM 800-160 MG PO TABS: 1.0000 | ORAL_TABLET | Freq: Two times a day (BID) | ORAL | 0 refills | Status: AC

## 2024-02-17 NOTE — Discharge Instructions (Addendum)
 We are treating you for urinary tract infection.  We gave you an injection of antibiotics today and I would like you to start Bactrim  DS twice daily for 7 days.  If you develop any rash or oral infection stop the medication and be seen immediately.  Make sure that you are drinking plenty of fluid.  We will contact you if we need to change or stop your antibiotics based on your culture results.  You did have some blood in your urine which is likely related to the infection.  Please follow-up with your primary care to have your urine repeated in 2 to 4 weeks to make sure that this blood goes away.  I also recommend you follow-up with a urologist; call to schedule an appointment.  If anything worsens and you have fever, abdominal pain, nausea, vomiting, difficulty passing urine, weakness you need to go to the emergency room immediately.

## 2024-02-17 NOTE — ED Provider Notes (Signed)
 [facility]    CSN: [id number] Arrival date & time: 02/17/24  9043      History   Chief Complaint Chief Complaint  Patient presents with   UTI Symptoms    HPI Brian Cannon is a 63 y.o. male.   Patient presents today with a several week history of urinary symptoms including frequency, urgency, dysuria.  He has had a similar symptoms about 2-1/2 years ago that were ultimately diagnosed as a UTI but denies history of recurrent UTI or seeing urology.  He reports that his symptoms resolved with antibiotic treatment at that time.  Denies history of single kidney, nephrolithiasis, recent urogenital procedure, catheterization.  He was feeling poorly and so went to the emergency room on 02/01/2024 as well as 02/10/2024 at which point he was given symptomatic treatment with improvement of symptoms and so advanced imaging was deferred.  Blood work obtained during these visits CBC, CMP, lipase were essentially normal.  He denies any recent antibiotic use.  He has no concern for STI and declined STI testing.  Denies any associated penile discharge or genital lesions.  He denies history of diabetes and does not take an aspirin  due to inhibitor; last A1c obtained September 2025 was 6.0%.  He did start taking Azo yesterday which provided no improvement of symptoms.    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   Decreased strength 11/25/2023   Abnormal posture 11/25/2023   Pure hypercholesterolemia 11/20/2023   Prediabetes 11/20/2023   Non-recurrent bilateral inguinal hernia without obstruction or gangrene 03/17/2023   AAA (abdominal aortic aneurysm) 10/01/2021   Aortic dissection (HCC) 10/01/2021   Hypomagnesemia  06/10/2021   Abdominal cramps 06/10/2021   Constipation 06/10/2021   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   Primary hypertension 06/06/2021   Hyponatremia 06/06/2021   Hypokalemia 06/06/2021   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   Aortic dissection, abdominal (HCC) 06/06/2021   Thoracic aortic dissection (HCC) 06/06/2021    Past Surgical History:  Procedure Laterality Date   CAROTID-SUBCLAVIAN BYPASS GRAFT Left 10/01/2021   Procedure: BYPASS GRAFT LEFT CAROTID-SUBCLAVIAN;  Surgeon: Lanis Fonda BRAVO, MD;  Location: [facility] OR;  Service: Vascular;  Laterality: Left;   THORACIC AORTIC ENDOVASCULAR STENT GRAFT N/A 10/01/2021   Procedure: THORACIC AORTIC ENDOVASCULAR STENT GRAFT;  Surgeon: Lanis Fonda BRAVO, MD;  Location: [facility] OR;  Service: Vascular;  Laterality: N/A;   ULTRASOUND GUIDANCE FOR VASCULAR ACCESS Right 10/01/2021   Procedure: ULTRASOUND GUIDANCE FOR VASCULAR ACCESS, RIGHT FEMORAL ARTERY;  Surgeon: Lanis Fonda BRAVO, MD;  Location: [facility] OR;  Service: Vascular;  Laterality: Right;       Home Medications    Prior to Admission medications   Medication Sig Start Date End Date Taking? Authorizing Provider  amoxicillin (AMOXIL) 500 MG capsule Take 500 mg by mouth 3 (three) times daily. 12/05/23  Yes [provider]  Blood Pressure Monitor DEVI Essential HTN; dx. Code: I10. 02/15/24  Yes [provider]  gabapentin (NEURONTIN) 300 MG capsule Take 300 mg by mouth 2 (two) times daily. 02/17/23  Yes [provider]  GAVILYTE-C 240 g solution Take 4,000 mLs by mouth as directed. 10/19/23  Yes [provider]  methocarbamol (  ROBAXIN) 750 MG tablet Take 750 mg by mouth. 11/04/23  Yes [provider]  phenazopyridine (PYRIDIUM) 95 MG tablet Take 95 mg by mouth 3 (three) times daily as needed for pain.   Yes [provider]  sulfamethoxazole -trimethoprim  (BACTRIM  DS) 800-160 MG tablet Take 1 tablet by mouth 2 (two) times daily  for 7 days. 02/17/24 02/24/24 Yes Allissa Albright K, PA-C  acetaminophen  (TYLENOL ) 325 MG tablet Take 650 mg by mouth every 6 (six) hours as needed for mild pain.    [provider]  aspirin  EC 81 MG tablet Take 1 tablet (81 mg total) by mouth daily at 6 (six) AM. Swallow whole. 10/09/21   Baglia, Corrina, PA-C  carvedilol  (COREG ) 12.5 MG tablet TAKE 1 TABLET BY MOUTH TWICE A DAY WITH FOOD 04/26/23   Alvan Ronal BRAVO, MD  hydrochlorothiazide  (HYDRODIURIL ) 12.5 MG tablet TAKE 1 TABLET BY MOUTH EVERY DAY 04/26/23   Alvan Ronal BRAVO, MD  metoprolol  tartrate (LOPRESSOR ) 100 MG tablet Take 1 tablet (100 mg total) by mouth once for 1 dose. Take 90-120 minutes prior to scan. Hold for SBP less than 110. 11/21/23 01/05/24  Floretta Mallard, MD  Multiple Vitamin (MULTIVITAMIN PO) Take 1 tablet by mouth daily.    [provider]  Multiple Vitamin (MULTIVITAMIN WITH MINERALS) TABS tablet Take 1 tablet by mouth daily.    [provider]  omeprazole (PRILOSEC) 40 MG capsule Take 40 mg by mouth daily. 10/13/23   [provider]  ondansetron  (ZOFRAN -ODT) 4 MG disintegrating tablet Take 1 tablet (4 mg total) by mouth every 8 (eight) hours as needed for nausea or vomiting. 02/01/24   Fernand Rossie HERO, MD  rosuvastatin  (CRESTOR ) 20 MG tablet Take 1 tablet (20 mg total) by mouth daily. 02/14/24 05/14/24  Floretta Mallard, MD  traMADol (ULTRAM) 50 MG tablet Take 50 mg by mouth.    [provider]    Family History Family History  Problem Relation Age of Onset   Heart attack Mother    Prostate cancer Father     Social History Social History   Tobacco Use   Smoking status: Former    Current packs/day: 1.00    Types: Cigarettes    Passive exposure: Past   Smokeless tobacco: Never  Vaping Use   Vaping status: Never Used  Substance Use Topics   Alcohol use: Yes    Alcohol/week: 1.0 standard drink of alcohol    Types: 1 Cans of beer per week   Drug use: No     Allergies    Other   Review of Systems Review of Systems  Constitutional:  Positive for activity change. Negative for appetite change, fatigue and fever.  Gastrointestinal:  Negative for abdominal pain, diarrhea, nausea and vomiting.  Genitourinary:  Positive for dysuria and frequency. Negative for flank pain, genital sores, hematuria, penile discharge, penile pain, testicular pain and urgency.  Musculoskeletal:  Negative for arthralgias and myalgias.     Physical Exam Triage Vital Signs ED Triage Vitals  Encounter Vitals Group     BP 02/17/24 1113 130/76     Girls Systolic BP Percentile --      Girls Diastolic BP Percentile --      Boys Systolic BP Percentile --      Boys Diastolic BP Percentile --      Pulse Rate 02/17/24 1113 88     Resp 02/17/24 1113 18     Temp 02/17/24 1113 97.8 F (36.6 C)     Temp  Source 02/17/24 1113 Oral     SpO2 02/17/24 1113 98 %     Weight 02/17/24 1112 174 lb 2.6 oz (79 kg)     Height 02/17/24 1112 6' 1 (1.854 m)     Head Circumference --      Peak Flow --      Pain Score 02/17/24 1109 0     Pain Loc --      Pain Education --      Exclude from Growth Chart --    No data found.  Updated Vital Signs BP 130/76 (BP Location: Left Arm)   Pulse 88   Temp 97.8 F (36.6 C) (Oral)   Resp 18   Ht 6' 1 (1.854 m)   Wt 174 lb 2.6 oz (79 kg)   SpO2 98%   BMI 22.98 kg/m   Visual Acuity Right Eye Distance:   Left Eye Distance:   Bilateral Distance:    Right Eye Near:   Left Eye Near:    Bilateral Near:     Physical Exam Vitals reviewed.  Constitutional:      General: He is awake.     Appearance: Normal appearance. He is well-developed. He is not ill-appearing.     Comments: Very pleasant male appears stated age in no acute distress sitting comfortable in exam room  HENT:     Head: Normocephalic and atraumatic.     Mouth/Throat:     Pharynx: Uvula midline. No oropharyngeal exudate or posterior oropharyngeal erythema.  Cardiovascular:     Rate  and Rhythm: Normal rate and regular rhythm.     Heart sounds: Normal heart sounds, S1 normal and S2 normal. No murmur heard. Pulmonary:     Effort: Pulmonary effort is normal.     Breath sounds: Normal breath sounds. No stridor. No wheezing, rhonchi or rales.     Comments: Clear to auscultation bilaterally Abdominal:     General: Bowel sounds are normal.     Palpations: Abdomen is soft.     Tenderness: There is no abdominal tenderness. There is no right CVA tenderness, left CVA tenderness, guarding or rebound.     Comments: Benign abdominal exam  Neurological:     Mental Status: He is alert.  Psychiatric:        Behavior: Behavior is cooperative.      UC Treatments / Results  Labs (all labs ordered are listed, but only abnormal results are displayed) Labs Reviewed  POCT URINE DIPSTICK - Abnormal; Notable for the following components:      Result Value   Color, UA orange (*)    Glucose, UA =100 (*)    Ketones, POC UA trace (5) (*)    Blood, UA trace-lysed (*)    POC PROTEIN,UA =30 (*)    Nitrite, UA Positive (*)    Leukocytes, UA Trace (*)    All other components within normal limits  GLUCOSE, POCT (MANUAL RESULT ENTRY) - Abnormal; Notable for the following components:   POCT Glucose ([facility]) 103 (*)    All other components within normal limits  URINE CULTURE    EKG   Radiology No results found.  Procedures Procedures (including critical care time)  Medications Ordered in UC Medications  cefTRIAXone  (ROCEPHIN ) injection 1 g (1 g Intramuscular Given 02/17/24 1210)    Initial Impression / Assessment and Plan / UC Course  I have reviewed the triage vital signs and the nursing notes.  Pertinent labs & imaging results that were available during my  care of the patient were reviewed by me and considered in my medical decision making (see chart for details).     Patient is well-appearing, afebrile, nontoxic, nontachycardic.  Vital signs and physical exam are reassuring  with no indication for emergent evaluation or imaging.  UA is consistent with a UTI, however, patient did take Azo which could be impacting results.  Given he is symptomatic we will cover for UTI and he was given 1 g of Rocephin  in clinic and started on Bactrim  DS twice daily for 7 days.  We discussed that if he develops any rash or lesions he should stop the medication be seen immediately.  No indication for dose adjustment based on metabolic panel from 02/10/2024 with a creatinine of 0.95 and calculated creatinine clearance of 88 mL/min.  He was encouraged to push fluids and use Tylenol  for pain relief.  I did recommend he follow-up with urologist we also discussed that he would need to have a repeat urine with either urology or his PCP in a few weeks to make sure that the blood noted today is related to either the Azo use or the urinary tract infection and resolves with treatment.  He did have glucosuria which is likely related to color interference from the Azo as his blood sugar was appropriate at 103.  I did offer blood work including CBC, CMP, A1c but patient reports that since he just had blood work in the emergency room that was normal last week he preferred to defer this and follow-up with his primary care instead.  We discussed at length that if anything changes or worsens and he has fever, abdominal pain, nausea/vomiting, difficulty passing urine, generalized weakness, persistent urinary symptoms despite antibiotics he needs to be seen emergently.  Return precautions given.  Patient expressed understanding and agreement to treatment plan.  Final Clinical Impressions(s) / UC Diagnoses   Final diagnoses:  Acute cystitis with hematuria  Abnormal urinalysis     Discharge Instructions      We are treating you for urinary tract infection.  We gave you an injection of antibiotics today and I would like you to start Bactrim  DS twice daily for 7 days.  If you develop any rash or oral infection stop  the medication and be seen immediately.  Make sure that you are drinking plenty of fluid.  We will contact you if we need to change or stop your antibiotics based on your culture results.  You did have some blood in your urine which is likely related to the infection.  Please follow-up with your primary care to have your urine repeated in 2 to 4 weeks to make sure that this blood goes away.  I also recommend you follow-up with a urologist; call to schedule an appointment.  If anything worsens and you have fever, abdominal pain, nausea, vomiting, difficulty passing urine, weakness you need to go to the emergency room immediately.     ED Prescriptions     Medication Sig Dispense Auth. Provider   sulfamethoxazole -trimethoprim  (BACTRIM  DS) 800-160 MG tablet Take 1 tablet by mouth 2 (two) times daily for 7 days. 14 tablet Sheretha Shadd K, PA-C      PDMP not reviewed this encounter.   Sherrell Rocky POUR, PA-C 02/17/24 1215

## 2024-02-17 NOTE — ED Triage Notes (Signed)
 Patient reports having urinary discomfort for about 3 wks now but now with pain at times, nausea and possible fever. Last night started AZO which has helped some. No penis discharge or concern for STI.

## 2024-02-17 NOTE — Telephone Encounter (Signed)
 phone message, [phone] Brian Cannon was seen today and just called asking when should he begin his antibiotics. [phone]  Confirmed with provider Rocky Gilford, PA-C pt to start prescribed antibiotics tonight. Spoke with patient and gave him this information. Pt verbalized understanding to take Bactrim  every 12 hours, with the first dose tonight.

## 2024-02-18 LAB — URINE CULTURE: Culture: NO GROWTH

## 2024-02-20 ENCOUNTER — Ambulatory Visit (HOSPITAL_COMMUNITY): Payer: Self-pay

## 2024-02-24 IMAGING — DX DG ABDOMEN 2V
2 series · 2 of 2 positions shown · non-contrast
Comparison: None.

CLINICAL DATA: Abdominal pain, cramping, no bowel movements for 5
days

EXAM:
ABDOMEN - 2 VIEW

[abdomen erect]
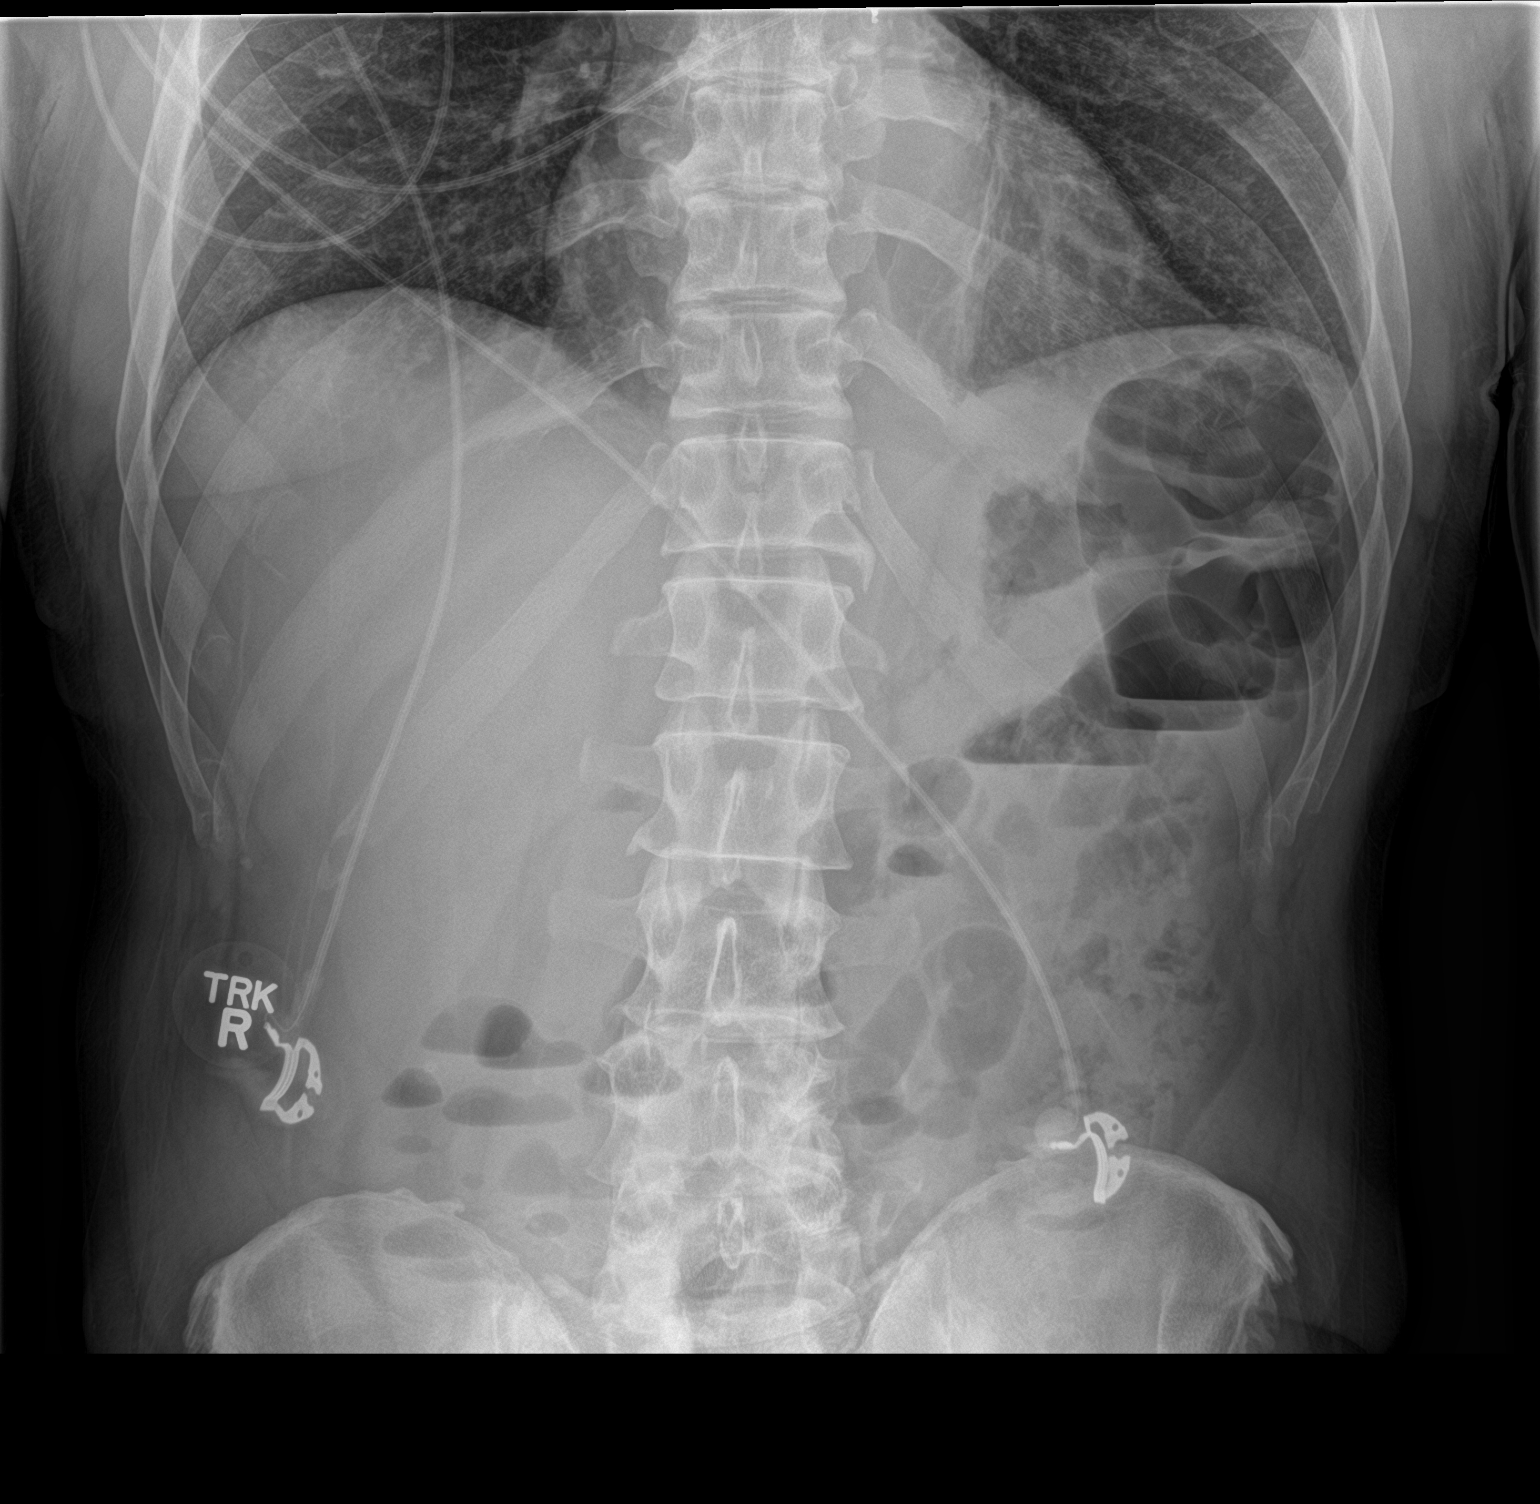

[abdomen supine]
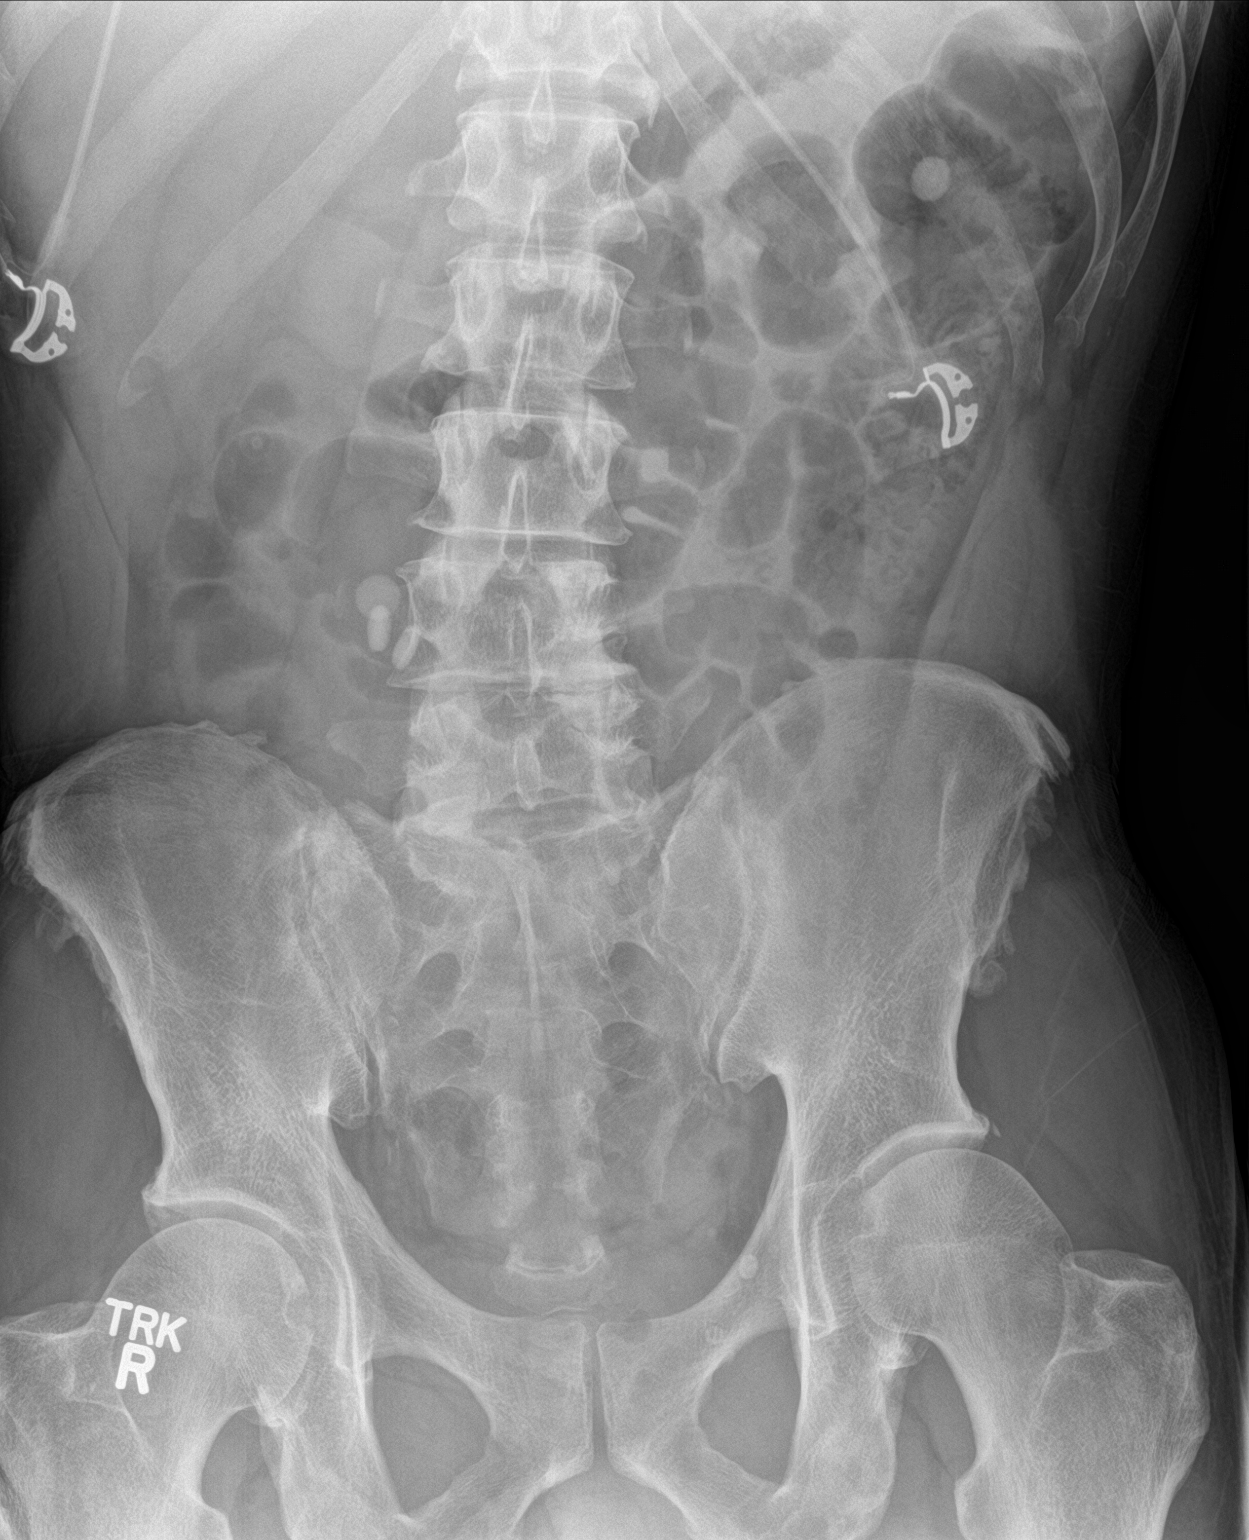

[2 of 2 positions shown; findings below may reference images not displayed]

FINDINGS: Nonobstructive pattern of bowel gas with gas and fluid-filled,
however nondistended loops of small bowel and colon throughout the
abdomen. Scattered gas and stool present in the distal colon. No
free air in the abdomen.
IMPRESSION: 1. Nonobstructive pattern of bowel gas with gas and fluid-filled,
however nondistended loops of small bowel and colon throughout the
abdomen.

2.  No large burden of stool.

## 2024-03-01 ENCOUNTER — Telehealth (HOSPITAL_COMMUNITY): Payer: Self-pay | Admitting: Cardiology

## 2024-03-01 NOTE — Telephone Encounter (Signed)
 Patient left message with HF triage, reporting he would like Dr Floretta to know [facility] recently did procedure. Reports aneurysm was noted in results   Please call to review details

## 2024-03-01 NOTE — Telephone Encounter (Signed)
 Pt states that he had a procedure done at this PCP [facility] and they wanted to refer him to cardiology d/t findings. He told them he already had a Cardiologist. Pt states his PCP compared results to test in October and findings were the same and there was nothing new to worry about. Told pt that PCP would need to fax over the test results for Dr Floretta to review if there were any issues.

## 2024-03-07 ENCOUNTER — Other Ambulatory Visit: Payer: Self-pay | Admitting: Student in an Organized Health Care Education/Training Program

## 2024-03-07 MED ORDER — CARVEDILOL 12.5 MG PO TABS: 12.5000 mg | ORAL_TABLET | Freq: Two times a day (BID) | ORAL | 2 refills | Status: AC
